# Patient Record
Sex: Male | Born: 1965 | Race: Black or African American | Hispanic: No | Marital: Married | Smoking: Former smoker
Health system: Southern US, Community
[De-identification: ages and names within clinical notes are randomized; demographics above are authoritative.]

## PROBLEM LIST (undated history)

## (undated) DIAGNOSIS — I10 Essential (primary) hypertension: Secondary | ICD-10-CM

## (undated) DIAGNOSIS — E119 Type 2 diabetes mellitus without complications: Secondary | ICD-10-CM

## (undated) DIAGNOSIS — M1990 Unspecified osteoarthritis, unspecified site: Secondary | ICD-10-CM

---

## 2015-10-24 ENCOUNTER — Other Ambulatory Visit: Payer: Self-pay | Admitting: Family Medicine

## 2017-09-13 ENCOUNTER — Encounter (HOSPITAL_BASED_OUTPATIENT_CLINIC_OR_DEPARTMENT_OTHER): Payer: Self-pay | Admitting: *Deleted

## 2017-09-13 ENCOUNTER — Emergency Department (HOSPITAL_BASED_OUTPATIENT_CLINIC_OR_DEPARTMENT_OTHER)
Admission: EM | Admit: 2017-09-13 | Discharge: 2017-09-13 | Disposition: A | Discharge status: Home / Self Care | Payer: Self-pay | Attending: Emergency Medicine | Admitting: Emergency Medicine

## 2017-09-13 DIAGNOSIS — Y939 Activity, unspecified: Secondary | ICD-10-CM | POA: Insufficient documentation

## 2017-09-13 DIAGNOSIS — Z79899 Other long term (current) drug therapy: Secondary | ICD-10-CM | POA: Insufficient documentation

## 2017-09-13 DIAGNOSIS — Y929 Unspecified place or not applicable: Secondary | ICD-10-CM | POA: Insufficient documentation

## 2017-09-13 DIAGNOSIS — X58XXXA Exposure to other specified factors, initial encounter: Secondary | ICD-10-CM | POA: Insufficient documentation

## 2017-09-13 DIAGNOSIS — F17200 Nicotine dependence, unspecified, uncomplicated: Secondary | ICD-10-CM | POA: Insufficient documentation

## 2017-09-13 DIAGNOSIS — Y999 Unspecified external cause status: Secondary | ICD-10-CM | POA: Insufficient documentation

## 2017-09-13 DIAGNOSIS — I10 Essential (primary) hypertension: Secondary | ICD-10-CM | POA: Insufficient documentation

## 2017-09-13 DIAGNOSIS — S46912A Strain of unspecified muscle, fascia and tendon at shoulder and upper arm level, left arm, initial encounter: Secondary | ICD-10-CM | POA: Insufficient documentation

## 2017-09-13 DIAGNOSIS — L239 Allergic contact dermatitis, unspecified cause: Secondary | ICD-10-CM | POA: Insufficient documentation

## 2017-09-13 HISTORY — DX: Unspecified osteoarthritis, unspecified site: M19.90

## 2017-09-13 HISTORY — DX: Essential (primary) hypertension: I10

## 2017-09-13 MED ORDER — PREDNISONE 20 MG PO TABS: ORAL_TABLET | ORAL | 0 refills | Status: DC

## 2017-09-13 MED ORDER — TRIAMCINOLONE ACETONIDE 0.025 % EX OINT: 1.0000 "application " | TOPICAL_OINTMENT | Freq: Two times a day (BID) | CUTANEOUS | 0 refills | Status: DC

## 2017-09-13 NOTE — ED Triage Notes (Signed)
Here for 2 complaints:  1.) c/o itchy rash/bites noted to all 4 extremities, and maybe a little on abd. (Denies rash b/w fingers, palms, feet, groin, back, chest or face). Itching worse at night. Onset ~ 3 weeks ago. No meds taken for sx.   2.) c/o L shoulder pain, pain constant, worse with movement, limited ROM, "can't lift it up", positive Neer's test.   Alert, NAD, calm, interactive, resps e/u, speaking in clear complete sentences, no dyspnea noted, skin W&D, VSS, (denies: known injury, fall, fever, NVD, numbness, tingling, weakness, CP, sob). Mentions h/o stiff neck and arthritis.

## 2017-09-13 NOTE — ED Provider Notes (Signed)
[facility] Provider Note   CSN: [id number] Arrival date & time: 09/13/17  7829     History   Chief Complaint Chief Complaint  Patient presents with  . Rash    HPI Zachary Flowers is a 51 y.o. male.  Patient is a 51 year old male who presents with rash and shoulder pain. He states he's had a rash for about a week. He states it's very itchy. It started with little bumps on his hands and progressed to his legs. He denies any known contacts with similar symptoms. He does think that he was maybe exposed to poison ivy. He denies any fevers. No vomiting. No facial swelling or shortness of breath. He also has some pain in his left shoulder. This is been going on for about 3 weeks. He states it hurts mostly when he tries to pick something up or raise his arm above his shoulder. He denies any radiation down his arm. No numbness or weakness to his arm.      Past Medical History:  Diagnosis Date  . Arthritis   . Hypertension     There are no active problems to display for this patient.   History reviewed. No pertinent surgical history.     Home Medications    Prior to Admission medications   Medication Sig Start Date End Date Taking? Authorizing Provider  AMLODIPINE BESYLATE PO Take by mouth.   Yes [provider]  HYDROCHLOROTHIAZIDE PO Take by mouth.   Yes [provider]  IBUPROFEN PO Take by mouth.   Yes [provider]  MELOXICAM PO Take by mouth.   Yes [provider]  predniSONE (DELTASONE) 20 MG tablet 3 tabs po day one, then 2 po daily x 4 days 09/13/17   Rolan Bucco, MD  triamcinolone (KENALOG) 0.025 % ointment Apply 1 application topically 2 (two) times daily. 09/13/17   Rolan Bucco, MD    Family History History reviewed. No pertinent family history.  Social History Social History  Substance Use Topics  . Smoking status: Current Every Day Smoker  . Smokeless tobacco: Never Used  . Alcohol use No      Allergies   Patient has no known allergies.   Review of Systems Review of Systems  Constitutional: Negative for chills, diaphoresis, fatigue and fever.  HENT: Negative for congestion, rhinorrhea and sneezing.   Eyes: Negative.   Respiratory: Negative for cough, chest tightness and shortness of breath.   Cardiovascular: Negative for chest pain and leg swelling.  Gastrointestinal: Negative for abdominal pain, blood in stool, diarrhea, nausea and vomiting.  Genitourinary: Negative for difficulty urinating, flank pain, frequency and hematuria.  Musculoskeletal: Positive for arthralgias. Negative for back pain.  Skin: Positive for rash.  Neurological: Negative for dizziness, speech difficulty, weakness, numbness and headaches.     Physical Exam Updated Vital Signs BP (!) 134/97   Pulse 75   Temp 98.5 F (36.9 C) (Oral)   Resp 18   Ht  (1.803 m)   Wt 90.7 kg (200 lb)   SpO2 98%   BMI 27.89 kg/m   Physical Exam  Constitutional: He is oriented to person, place, and time. He appears well-developed and well-nourished.  HENT:  Head: Normocephalic and atraumatic.  Eyes: Pupils are equal, round, and reactive to light.  Neck: Normal range of motion. Neck supple.  Cardiovascular: Normal rate, regular rhythm and normal heart sounds.   Pulmonary/Chest: Effort normal and breath sounds normal. No respiratory distress. He has no wheezes. He  has no rales. He exhibits no tenderness.  Abdominal: Soft. Bowel sounds are normal. There is no tenderness. There is no rebound and no guarding.  Musculoskeletal: He exhibits no edema.  Patient has tenderness on palpation of the lateral left shoulder. There is pain with abduction in the shoulder. There is no significant tenderness with external rotation. Is no pain to the elbow. No pain over the before meals joint. There is no swelling of the shoulder joint. He has normal sensation and motor function in the hand. Radial pulses are intact.   Lymphadenopathy:    He has no cervical adenopathy.  Neurological: He is alert and oriented to person, place, and time.  Skin: Skin is warm and dry. Rash noted.  Patient has small pustules on his upper extremities. There is none in the web spaces of the hands. No rash on the palms or soles. There is some clustering with some vesicles and pustules on the lower leg. There is no crusting. No petechiae or purpura. No surrounding cellulitis.  Psychiatric: He has a normal mood and affect.     ED Treatments / Results  Labs (all labs ordered are listed, but only abnormal results are displayed) Labs Reviewed - No data to display  EKG  EKG Interpretation None       Radiology No results found.  Procedures Procedures (including critical care time)  Medications Ordered in ED Medications - No data to display   Initial Impression / Assessment and Plan / ED Course  I have reviewed the triage vital signs and the nursing notes.  Pertinent labs & imaging results that were available during my care of the patient were reviewed by me and considered in my medical decision making (see chart for details).     Patient presents with 2 complaints. The rash looks consistent with a contact dermatitis type rash, likely from poison ivy. I will start him on a prednisone as well as triamcinolone cream. The shoulder sounds consistent with possible tendinitis. He has an orthopedist in [facility] and he will follow up with. He has no neurologic deficits. I don't feel at this point that he needs a sling. He was discharged home in good condition. He was advised to have his blood pressure rechecked by his primary care physician. He was advised not to use anti-inflammatory medications while taking the prednisone. Return precautions were given.  Final Clinical Impressions(s) / ED Diagnoses   Final diagnoses:  Allergic contact dermatitis, unspecified trigger  Shoulder strain, left, initial encounter    New  Prescriptions New Prescriptions   PREDNISONE (DELTASONE) 20 MG TABLET    3 tabs po day one, then 2 po daily x 4 days   TRIAMCINOLONE (KENALOG) 0.025 % OINTMENT    Apply 1 application topically 2 (two) times daily.     Rolan Bucco, MD 09/13/17 [phone]

## 2020-03-03 ENCOUNTER — Emergency Department (HOSPITAL_BASED_OUTPATIENT_CLINIC_OR_DEPARTMENT_OTHER)
Admission: EM | Admit: 2020-03-03 | Discharge: 2020-03-03 | Disposition: A | Discharge status: Home / Self Care | Payer: Self-pay | Attending: Emergency Medicine | Admitting: Emergency Medicine

## 2020-03-03 ENCOUNTER — Encounter (HOSPITAL_BASED_OUTPATIENT_CLINIC_OR_DEPARTMENT_OTHER): Payer: Self-pay | Admitting: Emergency Medicine

## 2020-03-03 ENCOUNTER — Other Ambulatory Visit: Payer: Self-pay

## 2020-03-03 DIAGNOSIS — I10 Essential (primary) hypertension: Secondary | ICD-10-CM | POA: Insufficient documentation

## 2020-03-03 DIAGNOSIS — R21 Rash and other nonspecific skin eruption: Secondary | ICD-10-CM | POA: Insufficient documentation

## 2020-03-03 DIAGNOSIS — Z79899 Other long term (current) drug therapy: Secondary | ICD-10-CM | POA: Insufficient documentation

## 2020-03-03 DIAGNOSIS — Z87891 Personal history of nicotine dependence: Secondary | ICD-10-CM | POA: Insufficient documentation

## 2020-03-03 MED ORDER — HYDROXYZINE HCL 25 MG PO TABS: 25.0000 mg | ORAL_TABLET | Freq: Four times a day (QID) | ORAL | 0 refills | Status: DC

## 2020-03-03 MED ORDER — DEXAMETHASONE SODIUM PHOSPHATE 10 MG/ML IJ SOLN
10.0000 mg | Freq: Once | INTRAMUSCULAR | Status: AC
Start: 2020-03-03 — End: 2020-03-03
  Administered 2020-03-03: 21:00:00 10 mg via INTRAMUSCULAR
  Filled 2020-03-03: qty 1

## 2020-03-03 MED ORDER — PERMETHRIN 5 % EX CREA: TOPICAL_CREAM | CUTANEOUS | 0 refills | Status: DC

## 2020-03-03 MED ORDER — HYDROXYZINE HCL 25 MG PO TABS
25.0000 mg | ORAL_TABLET | Freq: Once | ORAL | Status: AC
  Administered 2020-03-03: 25 mg via ORAL
  Filled 2020-03-03: qty 1

## 2020-03-03 NOTE — ED Triage Notes (Signed)
Reports rash all over body for several days.  Taking benadryl at home.  Last dose at 1pm.

## 2020-03-03 NOTE — ED Provider Notes (Signed)
[facility] EMERGENCY DEPARTMENT Provider Note   CSN: [id number] Arrival date & time: 03/03/20  1932     History Chief Complaint  Patient presents with  . Rash    Zachary Flowers is a 54 y.o. male.  Patient is a 54 year old male who presents with a rash.  He reports a 2 to 3-day history of an itchy rash.  It started with little bumps on his forearms and progressed to his hands and then all over his body.  He says it is very itchy and bothering him because of the itching.  He has not had any new exposures to anything.  No known exposures to scabies.  He has another family member at home but does not have a rash.  He has had allergic reaction to a knee brace in the past but has not had a rash like this.  He has been using Benadryl with no improvement in symptoms.  He has no wheezing or airway involvement.  No swelling of his lips or tongue.  He does not otherwise feel unwell.  No fevers.        Past Medical History:  Diagnosis Date  . Arthritis   . Hypertension     There are no problems to display for this patient.   History reviewed. No pertinent surgical history.     No family history on file.  Social History   Tobacco Use  . Smoking status: Former Games developer  . Smokeless tobacco: Never Used  Substance Use Topics  . Alcohol use: No  . Drug use: No    Home Medications Prior to Admission medications   Medication Sig Start Date End Date Taking? Authorizing Provider  AMLODIPINE BESYLATE PO Take by mouth.    [provider]  HYDROCHLOROTHIAZIDE PO Take by mouth.    [provider]  hydrOXYzine (ATARAX/VISTARIL) 25 MG tablet Take 1 tablet (25 mg total) by mouth every 6 (six) hours. 03/03/20   Rolan Bucco, MD  IBUPROFEN PO Take by mouth.    [provider]  MELOXICAM PO Take by mouth.    [provider]  permethrin (ELIMITE) 5 % cream Apply from head to toe, including scalp.  Leave on for 8 hours, then wash off.  May repeat in  one week if needed. 03/03/20   Rolan Bucco, MD  predniSONE (DELTASONE) 20 MG tablet 3 tabs po day one, then 2 po daily x 4 days 09/13/17   Rolan Bucco, MD  triamcinolone (KENALOG) 0.025 % ointment Apply 1 application topically 2 (two) times daily. 09/13/17   Rolan Bucco, MD    Allergies    Patient has no known allergies.  Review of Systems   Review of Systems  Constitutional: Negative for chills, diaphoresis, fatigue and fever.  HENT: Negative for congestion, rhinorrhea and sneezing.   Eyes: Negative.   Respiratory: Negative for cough, chest tightness and shortness of breath.   Cardiovascular: Negative for chest pain and leg swelling.  Gastrointestinal: Negative for abdominal pain, blood in stool, diarrhea, nausea and vomiting.  Genitourinary: Negative for difficulty urinating, flank pain, frequency and hematuria.  Musculoskeletal: Negative for arthralgias and back pain.  Skin: Positive for rash.  Neurological: Negative for dizziness, speech difficulty, weakness, numbness and headaches.    Physical Exam Updated Vital Signs BP (!) 159/114 (BP Location: Left Arm)   Pulse 82   Temp 98.8 F (37.1 C) (Oral)   Resp 18   Ht 6' (1.829 m)   Wt 95.3 kg  SpO2 100%   BMI 28.48 kg/m   Physical Exam Constitutional:      Appearance: He is well-developed.  HENT:     Head: Normocephalic and atraumatic.  Eyes:     Pupils: Pupils are equal, round, and reactive to light.  Cardiovascular:     Rate and Rhythm: Normal rate and regular rhythm.     Heart sounds: Normal heart sounds.  Pulmonary:     Effort: Pulmonary effort is normal. No respiratory distress.     Breath sounds: Normal breath sounds. No wheezing or rales.  Chest:     Chest wall: No tenderness.  Abdominal:     General: Bowel sounds are normal.     Palpations: Abdomen is soft.     Tenderness: There is no abdominal tenderness. There is no guarding or rebound.  Musculoskeletal:        General: Normal range of motion.      Cervical back: Normal range of motion and neck supple.  Lymphadenopathy:     Cervical: No cervical adenopathy.  Skin:    General: Skin is warm and dry.     Findings: Rash present.     Comments: Patient has small raised bumps to his forearm and chest.  Some of the bumps have a small whitehead on top of them.  There is some in the webspaces of the fingers and on the palms.  There is no vesicles.  No petechiae or purpura.  Neurological:     Mental Status: He is alert and oriented to person, place, and time.     ED Results / Procedures / Treatments   Labs (all labs ordered are listed, but only abnormal results are displayed) Labs Reviewed - No data to display  EKG None  Radiology No results found.  Procedures Procedures (including critical care time)  Medications Ordered in ED Medications  dexamethasone (DECADRON) injection 10 mg (has no administration in time range)  hydrOXYzine (ATARAX/VISTARIL) tablet 25 mg (has no administration in time range)    ED Course  I have reviewed the triage vital signs and the nursing notes.  Pertinent labs & imaging results that were available during my care of the patient were reviewed by me and considered in my medical decision making (see chart for details).    MDM Rules/Calculators/A&P                      Patient with a diffuse pruritic rash.  It looks a bit like scabies although he has not had any known exposures to scabies.  I will go ahead and treat him with Elimite cream and also treat for possible allergic reaction with a shot of Decadron and prescription for Atarax for the itching.  He has an appointment in April to follow-up with his PCP in [facility].  He [facility] the community clinic in [facility].  I advised him if the rash is not improving to try to get in sooner.  If he has any worsening symptoms he can be seen again in the emergency room. Final Clinical Impression(s) / ED Diagnoses Final diagnoses:  Rash    Rx / DC  Orders ED Discharge Orders         Ordered    hydrOXYzine (ATARAX/VISTARIL) 25 MG tablet  Every 6 hours     03/03/20 2040    permethrin (ELIMITE) 5 % cream     03/03/20 2040           Zachary Vilardi,  MD 03/03/20 2043

## 2020-03-14 ENCOUNTER — Encounter (HOSPITAL_BASED_OUTPATIENT_CLINIC_OR_DEPARTMENT_OTHER): Payer: Self-pay

## 2020-03-14 ENCOUNTER — Other Ambulatory Visit: Payer: Self-pay

## 2020-03-14 ENCOUNTER — Emergency Department (HOSPITAL_BASED_OUTPATIENT_CLINIC_OR_DEPARTMENT_OTHER)
Admission: EM | Admit: 2020-03-14 | Discharge: 2020-03-15 | Disposition: A | Discharge status: Home / Self Care | Payer: Self-pay | Attending: Emergency Medicine | Admitting: Emergency Medicine

## 2020-03-14 DIAGNOSIS — F1210 Cannabis abuse, uncomplicated: Secondary | ICD-10-CM | POA: Insufficient documentation

## 2020-03-14 DIAGNOSIS — Z87891 Personal history of nicotine dependence: Secondary | ICD-10-CM | POA: Insufficient documentation

## 2020-03-14 DIAGNOSIS — R21 Rash and other nonspecific skin eruption: Secondary | ICD-10-CM | POA: Insufficient documentation

## 2020-03-14 DIAGNOSIS — I10 Essential (primary) hypertension: Secondary | ICD-10-CM | POA: Insufficient documentation

## 2020-03-14 NOTE — ED Triage Notes (Signed)
Pt c/o scattered rash x 2-3 weeks-seen here for same 3/28-states no better

## 2020-03-15 LAB — RPR: RPR Ser Ql: NONREACTIVE

## 2020-03-15 MED ORDER — HYDROXYZINE HCL 25 MG PO TABS: 25.0000 mg | ORAL_TABLET | Freq: Four times a day (QID) | ORAL | 0 refills | Status: DC

## 2020-03-15 MED ORDER — FLUCONAZOLE 200 MG PO TABS: 200.0000 mg | ORAL_TABLET | Freq: Every day | ORAL | 0 refills | Status: DC

## 2020-03-15 MED ORDER — FLUCONAZOLE 150 MG PO TABS
ORAL_TABLET | ORAL | Status: AC
  Administered 2020-03-15: 01:00:00 150 mg
  Filled 2020-03-15: qty 1

## 2020-03-15 MED ORDER — METHYLPREDNISOLONE SODIUM SUCC 125 MG IJ SOLR
125.0000 mg | Freq: Once | INTRAMUSCULAR | Status: AC
  Administered 2020-03-15: 01:00:00 125 mg via INTRAMUSCULAR
  Filled 2020-03-15: qty 2

## 2020-03-15 MED ORDER — FLUCONAZOLE 200 MG PO TABS
200.0000 mg | ORAL_TABLET | Freq: Once | ORAL | Status: DC
  Filled 2020-03-15: qty 1

## 2020-03-15 MED ORDER — PREDNISONE 20 MG PO TABS: ORAL_TABLET | ORAL | 0 refills | Status: DC

## 2020-03-15 MED ORDER — TRIAMCINOLONE ACETONIDE 0.025 % EX OINT: 1.0000 "application " | TOPICAL_OINTMENT | Freq: Two times a day (BID) | CUTANEOUS | 0 refills | Status: DC

## 2020-03-15 NOTE — ED Provider Notes (Signed)
Emergency Department Provider Note   I have reviewed the triage vital signs and the nursing notes.   HISTORY  Chief Complaint Rash   HPI Zachary Flowers is a 54 y.o. male with medical problems as listed below who presents to the emergency department today with 3 days of rash.  Patient states that started on his chest with some vesicular lesions.  Progressively worsening and now is on his side both arms and his palms.  He has a couple spots on his back and a couple spots on his legs but not new.  The rest of his body is rash free.  He has had no fevers, nausea, vomiting or other associated symptoms.  He switched over to hypoallergenic soap.  He was seen here about 10 days ago was started on permethrin and hydroxyzine states helps with itching but the rash is getting worse even with the permethrin.  Does not notice any bugs that have been biting him.  Also does not know anything he might be allergic to.   No other associated or modifying symptoms.    Past Medical History:  Diagnosis Date  . Arthritis   . Hypertension     There are no problems to display for this patient.   History reviewed. No pertinent surgical history.  Current Outpatient Rx  . Order #: [id number] Class: Historical Med  . Order #: [id number] Class: Print  . Order #: [id number] Class: Historical Med  . Order #: [id number] Class: Print  . Order #: [id number] Class: Historical Med  . Order #: [id number] Class: Historical Med  . Order #: [id number] Class: Print  . Order #: [id number] Class: Print    Allergies Patient has no known allergies.  No family history on file.  Social History Social History   Tobacco Use  . Smoking status: Former Research scientist (life sciences)  . Smokeless tobacco: Never Used  Substance Use Topics  . Alcohol use: No  . Drug use: Yes    Types: Marijuana    Review of Systems  All other systems negative except as documented in the HPI. All pertinent positives and negatives as reviewed in the  HPI. ____________________________________________   PHYSICAL EXAM:  VITAL SIGNS: ED Triage Vitals  Enc Vitals Group     BP 03/14/20 2212 136/90     Pulse Rate 03/14/20 2212 87     Resp 03/14/20 2212 16     Temp 03/14/20 2212 98.8 F (37.1 C)     Temp Source 03/14/20 2212 Oral     SpO2 03/14/20 2212 99 %     Weight 03/14/20 2212 214 lb 1.6 oz (97.1 kg)     Height --      Head Circumference --      Peak Flow --      Pain Score 03/14/20 2226 5     Pain Loc --      Pain Edu? --      Excl. in [place]? --     Constitutional: Alert and oriented. Well appearing and in no acute distress. Eyes: Conjunctivae are normal. PERRL. EOMI. Head: Atraumatic. Nose: No congestion/rhinnorhea. Mouth/Throat: Mucous membranes are moist.  Oropharynx non-erythematous. Neck: No stridor.  No meningeal signs.   Cardiovascular: Normal rate, regular rhythm. Good peripheral circulation. Grossly normal heart sounds.   Respiratory: Normal respiratory effort.  No retractions. Lungs CTAB. Gastrointestinal: Soft and nontender. No distention.  Musculoskeletal: No lower extremity tenderness nor edema. No gross deformities of extremities. Neurologic:  Normal speech and language. No gross focal neurologic deficits are appreciated.  Skin: Multiple  areas on his torso mostly the left and anterior portions of grouped vesicular lesions with central clearing.  Pruritic.  Mildly erythematous.  Mildly painful.  Not significantly warm to touch.  Draining clear fluid.  Has some gray lesions on the palms of both hands that are coalescing.  Also has some similar vesicular lesions on his hands.  Has less amount of lesions on the right flank, back and lower extremities.  None on the face or neck.   ____________________________________________   INITIAL IMPRESSION / ASSESSMENT AND PLAN / ED COURSE  Difficult to fully diagnose with the rashes.  With the grayish lesions on his palms will check for syphilis but think this is less likely  as it is itching all over.  Also consider possible contact dermatitis however does not have that typical distribution for that.  Also consider a fungal rash as well.  If not consistent with EM, meningococcemia, HSP or other emergent rashes.  Does not appear consistent with [facility] spotted fever either.  Also considered atypical varicella but distribution does not seem right. Treat symptoms with hydroxyzine and steroids.  We will also cover for possible fungal rash and fluconazole.  Follow-up with PCP in 3 to 5 days if not improved.    Pertinent labs & imaging results that were available during my care of the patient were reviewed by me and considered in my medical decision making (see chart for details).  A medical screening exam was performed and I feel the patient has had an appropriate workup for their chief complaint at this time and likelihood of emergent condition existing is low. They have been counseled on decision, discharge, follow up and which symptoms necessitate immediate return to the emergency department. They or their family verbally stated understanding and agreement with plan and discharged in stable condition.   ____________________________________________  FINAL CLINICAL IMPRESSION(S) / ED DIAGNOSES  Final diagnoses:  Rash     MEDICATIONS GIVEN DURING THIS VISIT:  Medications  methylPREDNISolone sodium succinate (SOLU-MEDROL) 125 mg/2 mL injection 125 mg (has no administration in time range)  fluconazole (DIFLUCAN) tablet 200 mg (has no administration in time range)     NEW OUTPATIENT MEDICATIONS STARTED DURING THIS VISIT:  New Prescriptions   FLUCONAZOLE (DIFLUCAN) 200 MG TABLET    Take 1 tablet (200 mg total) by mouth daily.   PREDNISONE (DELTASONE) 20 MG TABLET    3 tabs po daily x 3 days, then 2 tabs x 3 days, then 1.5 tabs x 3 days, then 1 tab x 3 days, then 0.5 tabs x 3 days    Note:  This note was prepared with assistance of Dragon voice recognition  software. Occasional wrong-word or sound-a-like substitutions may have occurred due to the inherent limitations of voice recognition software.   Perez Dirico, Barbara Cower, MD 03/15/20 [phone]

## 2020-04-02 ENCOUNTER — Encounter (HOSPITAL_BASED_OUTPATIENT_CLINIC_OR_DEPARTMENT_OTHER): Payer: Self-pay

## 2020-04-02 ENCOUNTER — Emergency Department (HOSPITAL_BASED_OUTPATIENT_CLINIC_OR_DEPARTMENT_OTHER)
Admission: EM | Admit: 2020-04-02 | Discharge: 2020-04-02 | Disposition: A | Discharge status: Home / Self Care | Payer: Self-pay | Attending: Emergency Medicine | Admitting: Emergency Medicine

## 2020-04-02 ENCOUNTER — Other Ambulatory Visit: Payer: Self-pay

## 2020-04-02 DIAGNOSIS — Z87891 Personal history of nicotine dependence: Secondary | ICD-10-CM | POA: Insufficient documentation

## 2020-04-02 DIAGNOSIS — R21 Rash and other nonspecific skin eruption: Secondary | ICD-10-CM | POA: Insufficient documentation

## 2020-04-02 DIAGNOSIS — Z79899 Other long term (current) drug therapy: Secondary | ICD-10-CM | POA: Insufficient documentation

## 2020-04-02 DIAGNOSIS — I10 Essential (primary) hypertension: Secondary | ICD-10-CM | POA: Insufficient documentation

## 2020-04-02 MED ORDER — PREDNISONE 10 MG PO TABS: ORAL_TABLET | ORAL | 0 refills | Status: DC

## 2020-04-02 MED ORDER — PREDNISONE 10 MG PO TABS: 20.0000 mg | ORAL_TABLET | Freq: Every day | ORAL | 0 refills | Status: DC

## 2020-04-02 MED ORDER — PREDNISONE 10 MG PO TABS: ORAL_TABLET | ORAL | 0 refills | Status: AC

## 2020-04-02 NOTE — ED Provider Notes (Signed)
[facility] DEPARTMENT Provider Note   CSN: [id number] Arrival date & time: 04/02/20  1013     History Chief Complaint  Patient presents with  . Rash    Zachary Flowers is a 54 y.o. male.  HPI 54 year old male with a history of hypertension, arthritis with a history of a rash for several months presents to the ER for similar complaint.  Per chart review, patient was seen on 03/14/2020 and 03/03/2020 for same rash and received a multitude of treatments including Decadron, hydroxyzine, permethrin, fluconazole.  Patient reports compliance with the steroids prescribed him on his 03/14/20 visit and an improvement in his rash.  He states he took his last pill on Saturday and the rash started increasing again on Sunday.  He states he came to the ER because he does not want it to get out of hand like it did last time.  He reports  a rash to his right and left hand, and left ribs and sides.  States the rash is mildly itchy, mildly painful.  States that it becomes pustulous and he pops them.  Denies any fevers/chills.  He does not have any history of STDs.  He states that he was tested here for syphilis and was negative. No noticeable bug bites, patient does not work outside or near any plants.    Past Medical History:  Diagnosis Date  . Arthritis   . Hypertension     There are no problems to display for this patient.   History reviewed. No pertinent surgical history.     No family history on file.  Social History   Tobacco Use  . Smoking status: Former Research scientist (life sciences)  . Smokeless tobacco: Never Used  Substance Use Topics  . Alcohol use: No  . Drug use: Yes    Types: Marijuana    Home Medications Prior to Admission medications   Medication Sig Start Date End Date Taking? Authorizing Provider  AMLODIPINE BESYLATE PO Take by mouth.    [provider]  fluconazole (DIFLUCAN) 200 MG tablet Take 1 tablet (200 mg total) by mouth daily. 03/15/20   Mesner, Corene Cornea, MD   HYDROCHLOROTHIAZIDE PO Take by mouth.    [provider]  hydrOXYzine (ATARAX/VISTARIL) 25 MG tablet Take 1 tablet (25 mg total) by mouth every 6 (six) hours. 03/15/20   Mesner, Corene Cornea, MD  IBUPROFEN PO Take by mouth.    [provider]  MELOXICAM PO Take by mouth.    [provider]  predniSONE (DELTASONE) 10 MG tablet Take 3 tablets (30 mg total) by mouth daily with breakfast for 3 days, THEN 2 tablets (20 mg total) daily with breakfast for 3 days, THEN 1.5 tablets (15 mg total) daily with breakfast for 3 days, THEN 1 tablet (10 mg total) daily with breakfast for 3 days, THEN 0.5 tablets (5 mg total) daily with breakfast for 3 days. 04/02/20 04/17/20  Garald Balding, PA-C  triamcinolone (KENALOG) 0.025 % ointment Apply 1 application topically 2 (two) times daily. 03/15/20   Mesner, Corene Cornea, MD    Allergies    Ace inhibitors  Review of Systems   Review of Systems  Constitutional: Negative for chills and fever.  Genitourinary: Negative for difficulty urinating, dysuria, genital sores, penile pain and penile swelling.  Musculoskeletal: Negative for back pain and neck pain.  Skin: Positive for rash.    Physical Exam Updated Vital Signs BP (!) 140/107 (BP Location: Left Arm)   Pulse 97   Temp 98.6 F (  37 C) (Oral)   Resp 14   Ht 5\' 11"  (1.803 m)   Wt 97.5 kg   SpO2 100%   BMI 29.99 kg/m   Physical Exam Vitals and nursing note reviewed.  Constitutional:      General: He is not in acute distress.    Appearance: He is well-developed. He is obese. He is not ill-appearing, toxic-appearing or diaphoretic.  HENT:     Head: Normocephalic and atraumatic.     Mouth/Throat:     Mouth: Mucous membranes are moist.     Pharynx: Oropharynx is clear.  Eyes:     Conjunctiva/sclera: Conjunctivae normal.  Cardiovascular:     Rate and Rhythm: Normal rate and regular rhythm.     Heart sounds: No murmur.  Pulmonary:     Effort: Pulmonary effort is normal. No respiratory  distress.     Breath sounds: Normal breath sounds.  Abdominal:     Palpations: Abdomen is soft.     Tenderness: There is no abdominal tenderness.  Musculoskeletal:        General: Normal range of motion.     Cervical back: Neck supple.  Skin:    General: Skin is warm and dry.     Findings: Rash present. No bruising.     Comments: Papular rash in clusters in multiple areas of the body including the fingers, hands, left lateral abdomen in multiple stages of healing.  Round coalescent macular rash on left hand.  Please see photos and physical exam.  Neurological:     General: No focal deficit present.     Mental Status: He is alert and oriented to person, place, and time.  Psychiatric:        Mood and Affect: Mood normal.        Behavior: Behavior normal.           ED Results / Procedures / Treatments   Labs (all labs ordered are listed, but only abnormal results are displayed) Labs Reviewed - No data to display  EKG None  Radiology No results found.  Procedures Procedures (including critical care time)  Medications Ordered in ED Medications - No data to display  ED Course  I have reviewed the triage vital signs and the nursing notes.  Pertinent labs & imaging results that were available during my care of the patient were reviewed by me and considered in my medical decision making (see chart for details).    MDM Rules/Calculators/A&P                      54 year old male with recurrent rash. On presentation, patient is well-appearing, no acute distress.  Vitals reassuring, patient afebrile.  Patient reports he has a dermatology appointment in 2 weeks.  Based on previous chart review, it appears that this rash affects different areas.  Rashes itchy and mildly painful.  Patient was previously treated for scabies, fungal infection without much improvement.  Patient states that he was given steroids and fluconazole at last visit and rash did slightly improve but stopped  as soon he states he stopped the steroid.  Unclear if the fluconazole or the steroid helped, but I suspect the steroid was more helpful given as soon as he stopped it the rash returned.  Patient tested negative for syphilis on previous visit on 03/15/2020.  Not consistent with HSP, meningococcemia, Stevens-Johnson's rash, EM, [facility] spotted fever, Lyme disease.  Possible nummular eczema.  Will provide another steroid taper in hopes that  this will hold him over until his dermatology appointment.  Difficult to diagnose rash.  Return precautions given.  At this stage in the ED course, the patient has been medically screened and is stable for discharge.  Patient voices understanding is agreeable to this plan.   I discussed the case with Dr. Pecola Leisure and she is agreeable with the above plan. Final Clinical Impression(s) / ED Diagnoses Final diagnoses:  Rash    Rx / DC Orders ED Discharge Orders         Ordered    predniSONE (DELTASONE) 10 MG tablet  Daily,   Status:  Discontinued     04/02/20 1355    predniSONE (DELTASONE) 10 MG tablet  Status:  Discontinued     04/02/20 1357    predniSONE (DELTASONE) 10 MG tablet  Status:  Discontinued     04/02/20 1404    predniSONE (DELTASONE) 10 MG tablet  Status:  Discontinued     04/02/20 1406    predniSONE (DELTASONE) 10 MG tablet     04/02/20 1408           Leone Brand 04/02/20 1641    Tilden Fossa, MD 04/02/20 1743

## 2020-04-02 NOTE — ED Triage Notes (Signed)
Pt arrives with rash to hands and arms, reports he was recently on Prednisone states it was getting better but took his last medication on Saturday and states that the rash is returning.

## 2020-04-02 NOTE — Discharge Instructions (Addendum)
You were seen in the ER for a rash.  It is still unclear as to what the cause of this rash is but we will give you another dose of steroids until you are able to see the dermatologist.  It is very important that you keep your appointment in intended.  Return to the ER if your symptoms worsen.

## 2020-08-29 ENCOUNTER — Encounter (HOSPITAL_BASED_OUTPATIENT_CLINIC_OR_DEPARTMENT_OTHER): Payer: Self-pay | Admitting: Emergency Medicine

## 2020-08-29 ENCOUNTER — Emergency Department (HOSPITAL_BASED_OUTPATIENT_CLINIC_OR_DEPARTMENT_OTHER)
Admission: EM | Admit: 2020-08-29 | Discharge: 2020-08-29 | Discharge status: Against Medical Advice | Payer: Self-pay | Attending: Emergency Medicine | Admitting: Emergency Medicine

## 2020-08-29 ENCOUNTER — Emergency Department (HOSPITAL_BASED_OUTPATIENT_CLINIC_OR_DEPARTMENT_OTHER): Payer: Self-pay

## 2020-08-29 ENCOUNTER — Other Ambulatory Visit: Payer: Self-pay

## 2020-08-29 DIAGNOSIS — I10 Essential (primary) hypertension: Secondary | ICD-10-CM | POA: Insufficient documentation

## 2020-08-29 DIAGNOSIS — R456 Violent behavior: Secondary | ICD-10-CM | POA: Insufficient documentation

## 2020-08-29 DIAGNOSIS — Z87891 Personal history of nicotine dependence: Secondary | ICD-10-CM | POA: Insufficient documentation

## 2020-08-29 DIAGNOSIS — R202 Paresthesia of skin: Secondary | ICD-10-CM | POA: Insufficient documentation

## 2020-08-29 DIAGNOSIS — Z79899 Other long term (current) drug therapy: Secondary | ICD-10-CM | POA: Insufficient documentation

## 2020-08-29 LAB — TROPONIN I (HIGH SENSITIVITY): Troponin I (High Sensitivity): 8 ng/L (ref <18)

## 2020-08-29 MED ORDER — HALOPERIDOL LACTATE 5 MG/ML IJ SOLN
2.0000 mg | Freq: Once | INTRAMUSCULAR | Status: AC
  Administered 2020-08-29: 2 mg via INTRAVENOUS
  Filled 2020-08-29: qty 1

## 2020-08-29 MED ORDER — ACETAMINOPHEN 500 MG PO TABS
1000.0000 mg | ORAL_TABLET | Freq: Once | ORAL | Status: DC
  Filled 2020-08-29: qty 2

## 2020-08-29 NOTE — ED Notes (Signed)
  Patient states he would like to leave now and not wait on blood work.  Patient was given some IV medication for panic attack but states he does not feel better and can not breathe.  Patient requests PIV taken out so he can leave.  Patients wife at bedside attempting to talk patient into staying but patient is adamant that he is leaving now.  Dr Nicanor Alcon notified.

## 2020-08-29 NOTE — ED Provider Notes (Addendum)
[facility] EMERGENCY DEPARTMENT Provider Note   CSN: [id number] Arrival date & time: 08/29/20  0357     History Chief Complaint  Patient presents with  . Shortness of Breath  . Numbness    Kobie Whidby is a 54 y.o. male.  The history is provided by the patient and the spouse.  Shortness of Breath Severity:  Moderate Onset quality:  Sudden Timing:  Constant Progression:  Unchanged Chronicity:  New Context: emotional upset   Context: not URI   Context comment:  Got upset and has been pacing secondary to paresthesias in right middle finger  Relieved by:  Nothing Worsened by:  Nothing Ineffective treatments:  None tried Associated symptoms: no abdominal pain, no chest pain, no claudication, no cough, no diaphoresis, no ear pain, no fever, no headaches, no hemoptysis, no neck pain, no PND, no rash, no sore throat, no syncope and no swollen glands   Risk factors: no hx of PE/DVT   Patient with HTN and anxiety presents with SOB and feeling anxious related to paresthesias in R middle finger.  States the medial aspect of the finger has been tingling for 2.5 days and this is upsetting to him. No weakness.  No changes in vision or speech.  No chest pain.  No n/v/d.  No DOE.  No exertional symptoms.  No leg pain or swelling no travel.  No f/c/r.  No cough.  No covid exposure.  States he can't sleep and feels anxious.  Is tearing off his mask in the room and can't stay still.      Past Medical History:  Diagnosis Date  . Arthritis   . Hypertension     There are no problems to display for this patient.   History reviewed. No pertinent surgical history.     History reviewed. No pertinent family history.  Social History   Tobacco Use  . Smoking status: Former Games developer  . Smokeless tobacco: Never Used  Vaping Use  . Vaping Use: Never used  Substance Use Topics  . Alcohol use: No  . Drug use: Yes    Types: Marijuana    Home Medications Prior to Admission  medications   Medication Sig Start Date End Date Taking? Authorizing Provider  AMLODIPINE BESYLATE PO Take by mouth.    [provider]  fluconazole (DIFLUCAN) 200 MG tablet Take 1 tablet (200 mg total) by mouth daily. 03/15/20   Mesner, Barbara Cower, MD  HYDROCHLOROTHIAZIDE PO Take by mouth.    [provider]  hydrOXYzine (ATARAX/VISTARIL) 25 MG tablet Take 1 tablet (25 mg total) by mouth every 6 (six) hours. 03/15/20   Mesner, Barbara Cower, MD  IBUPROFEN PO Take by mouth.    [provider]  MELOXICAM PO Take by mouth.    [provider]  triamcinolone (KENALOG) 0.025 % ointment Apply 1 application topically 2 (two) times daily. 03/15/20   Mesner, Barbara Cower, MD    Allergies    Ace inhibitors  Review of Systems   Review of Systems  Constitutional: Negative for diaphoresis and fever.  HENT: Negative for ear pain and sore throat.   Eyes: Negative for visual disturbance.  Respiratory: Positive for shortness of breath. Negative for cough and hemoptysis.   Cardiovascular: Negative for chest pain, palpitations, claudication, leg swelling, syncope and PND.  Gastrointestinal: Negative for abdominal pain.  Genitourinary: Negative for difficulty urinating.  Musculoskeletal: Negative for neck pain.  Skin: Negative for rash.  Neurological: Negative for headaches.  Psychiatric/Behavioral: Positive for agitation. The patient  is nervous/anxious.   All other systems reviewed and are negative.   Physical Exam Updated Vital Signs BP (!) 161/107 (BP Location: Right Arm)   Pulse 92   Temp 97.9 F (36.6 C) (Oral)   Resp (!) 24   Ht 6' (1.829 m)   Wt 97.5 kg   SpO2 99%   BMI 29.16 kg/m   Physical Exam Vitals and nursing note reviewed.  Constitutional:      General: He is not in acute distress.    Appearance: He is not diaphoretic.  HENT:     Head: Normocephalic and atraumatic.     Nose: Nose normal.  Eyes:     Conjunctiva/sclera: Conjunctivae normal.     Pupils: Pupils  are equal, round, and reactive to light.  Cardiovascular:     Rate and Rhythm: Normal rate and regular rhythm.     Pulses: Normal pulses.     Heart sounds: Normal heart sounds.  Pulmonary:     Effort: Pulmonary effort is normal.     Breath sounds: Normal breath sounds.  Abdominal:     General: Abdomen is flat. Bowel sounds are normal.     Palpations: Abdomen is soft.     Tenderness: There is no abdominal tenderness. There is no guarding.  Musculoskeletal:        General: No tenderness. Normal range of motion.     Right lower leg: No edema.     Left lower leg: No edema.  Skin:    General: Skin is warm and dry.     Capillary Refill: Capillary refill takes less than 2 seconds.  Neurological:     General: No focal deficit present.     Mental Status: He is alert and oriented to person, place, and time.     Sensory: No sensory deficit.     Motor: No weakness.     Deep Tendon Reflexes: Reflexes normal.  Psychiatric:        Mood and Affect: Mood is anxious.        Behavior: Behavior is aggressive.     ED Results / Procedures / Treatments   Labs (all labs ordered are listed, but only abnormal results are displayed) Labs Reviewed  TROPONIN I (HIGH SENSITIVITY)    EKG EKG Interpretation  Date/Time:  Thursday August 29 2020 04:05:34 EDT Ventricular Rate:  93 PR Interval:    QRS Duration: 87 QT Interval:  380 QTC Calculation: 473 R Axis:   -16 Text Interpretation: Sinus rhythm Confirmed by Alsie Younes ([id number]) on 08/29/2020 4:07:41 AM   Radiology DG Chest Portable 1 View  Result Date: 08/29/2020 CLINICAL DATA:  Dyspnea EXAM: PORTABLE CHEST 1 VIEW COMPARISON:  11/14/2018 FINDINGS: Lungs are clear. No pneumothorax or pleural effusion. Cardiac size within normal limits. Mild ectasias of the thoracic aorta is likely artifactual and related to semi upright positioning. Pulmonary vascularity is normal. IMPRESSION: No active disease. Electronically Signed   By: Helyn Numbers MD    On: 08/29/2020 04:23    Procedures Procedures (including critical care time)  Medications Ordered in ED Medications  acetaminophen (TYLENOL) tablet 1,000 mg (1,000 mg Oral Not Given 08/29/20 0423)  haloperidol lactate (HALDOL) injection 2 mg (2 mg Intravenous Given 08/29/20 0427)    ED Course  I have reviewed the triage vital signs and the nursing notes.  Pertinent labs & imaging results that were available during my care of the patient were reviewed by me and considered in my medical decision making (see chart for  details).   Full neurologic exam is normal.  There are no signs of stroke.    EDP tried to calm the patient and start EKG looked normal and that we would ensure the patient had no life threatening illness but the patient is saturating 100% on room air with a normal heart rate and his lungs are completely clear.    Patient was very anxious and was given Haldol to calm him so we could proceed with the work up for cardiac issues.  EDP went to see another patient and was informed the patient left.  EKG is normal as is chest Xray.    I see no signs of infectious etiology.    Patient eloped during work up.  Patient was clearly having a panic attack but did not stay to rule out life threatening illnesses.    Chaitanya Amedee was evaluated in Emergency Department on 08/29/2020 for the symptoms described in the history of present illness. He was evaluated in the context of the global COVID-19 pandemic, which necessitated consideration that the patient might be at risk for infection with the SARS-CoV-2 virus that causes COVID-19. Institutional protocols and algorithms that pertain to the evaluation of patients at risk for COVID-19 are in a state of rapid change based on information released by regulatory bodies including the CDC and federal and state organizations. These policies and algorithms were followed during the patient's care in the ED.  Final Clinical Impression(s) / ED  Diagnoses Final diagnoses:  Paresthesias    Patient eloped during the work up, prior to reassessment      Ritaj Dullea, MD 08/29/20 [phone]

## 2020-08-29 NOTE — ED Triage Notes (Signed)
  Patient comes in with SOB and R arm numbness that he states has been going on for about 2 days.  Patient was recently started on a third BP medication and has hx of anxiety.  Patient states he has been pacing around all night because he feels like he cant breathe and is afraid to go to sleep.  R arm feels like he hit his "funny bone".  No cough, running nose, fever, or body aches.  Pain 5/10.

## 2020-08-31 ENCOUNTER — Emergency Department (HOSPITAL_BASED_OUTPATIENT_CLINIC_OR_DEPARTMENT_OTHER)
Admission: EM | Admit: 2020-08-31 | Discharge: 2020-08-31 | Disposition: A | Discharge status: Home / Self Care | Payer: Self-pay | Attending: Emergency Medicine | Admitting: Emergency Medicine

## 2020-08-31 ENCOUNTER — Encounter (HOSPITAL_BASED_OUTPATIENT_CLINIC_OR_DEPARTMENT_OTHER): Payer: Self-pay | Admitting: Emergency Medicine

## 2020-08-31 ENCOUNTER — Other Ambulatory Visit: Payer: Self-pay

## 2020-08-31 DIAGNOSIS — Z87891 Personal history of nicotine dependence: Secondary | ICD-10-CM | POA: Insufficient documentation

## 2020-08-31 DIAGNOSIS — F411 Generalized anxiety disorder: Secondary | ICD-10-CM | POA: Insufficient documentation

## 2020-08-31 DIAGNOSIS — F418 Other specified anxiety disorders: Secondary | ICD-10-CM

## 2020-08-31 DIAGNOSIS — R200 Anesthesia of skin: Secondary | ICD-10-CM

## 2020-08-31 DIAGNOSIS — R202 Paresthesia of skin: Secondary | ICD-10-CM | POA: Insufficient documentation

## 2020-08-31 DIAGNOSIS — I10 Essential (primary) hypertension: Secondary | ICD-10-CM | POA: Insufficient documentation

## 2020-08-31 MED ORDER — HYDROXYZINE HCL 50 MG PO TABS: 25.0000 mg | ORAL_TABLET | Freq: Four times a day (QID) | ORAL | 1 refills | Status: DC | PRN

## 2020-08-31 MED ORDER — HYDROXYZINE HCL 25 MG PO TABS
50.0000 mg | ORAL_TABLET | Freq: Once | ORAL | Status: AC
  Administered 2020-08-31: 50 mg via ORAL
  Filled 2020-08-31: qty 2

## 2020-08-31 MED ORDER — HYDROXYZINE HCL 50 MG PO TABS: 50.0000 mg | ORAL_TABLET | Freq: Four times a day (QID) | ORAL | 1 refills | Status: DC | PRN

## 2020-08-31 NOTE — Discharge Instructions (Signed)
Please follow-up with your primary care provider regarding today's encounter.  I recommend that you be placed on SSRI or similar maintenance medication for your generalized anxiety.  I have prescribed you hydroxyzine which she can take as needed for symptomatic management.  Do not combine with antihistamines.  I recommend that you get plenty of exercise and have hobbies outside of videogames.  Your video games may be contributing to your right finger numbness.  Please wear the splint at night in the event that this is related to carpal tunnel syndrome.  You may ultimately benefit from orthopedic evaluation for possible decompression if your symptoms fail to improve with conservative therapy.  NSAIDs as needed for discomfort.  I also like for you to begin seeing a therapist.  This is necessary for management of your anxiety symptoms.  Return to the ED or seek immediate medical attention should you experience any new or worsening symptoms.

## 2020-08-31 NOTE — ED Provider Notes (Signed)
[facility] EMERGENCY DEPARTMENT Provider Note   CSN: [id number] Arrival date & time: 08/31/20  1001     History Chief Complaint  Patient presents with  . Panic Attack    [name] is a 54 y.o. male with PMH significant for HTN and generalized anxiety disorder who presents to the ED with complaints of anxiety attack.  I reviewed patient's medical record and he was evaluated in the ER recently for same complaints.  Patient states that he has had a 1-1/2-week history of tingling "numbness" sensation on the ulnar aspect of his second digit.  There is no numbness or tingling appreciated on the radial aspect.  He also states that it is distal to his PIP joint.  No other areas of numbness/tingling.  He believes that the symptoms are what has been contributing to his significant anxiety which is what prompted him to come to the ED again today.  He has not been able to sleep well at home.  He is pacing around the house.  He is anxious because he thinks he has had a stroke.  He denies any other deficits.  His mother is at bedside and states that he also recently lost a friend/family member which she believes is contributing to his anxiety symptoms.  When he has an attack, he feels "hot" with mild associated crampy abdominal discomfort/nausea.  No vomiting.  He denies any headache or dizziness, fevers or chills, dysarthria, chest pain or difficulty breathing, diaphoresis, difficulty ambulating, other numbness or weakness, or any other focal deficits.  Patient has a primary care provider.  He is wanting benzodiazepines, as advised by his mother.  He states that he sits at home and plays copious amounts of video games.  He does not have a job and has not worked for nearly 2 years.  He has never seen a therapist for his anxiety symptoms.  HPI     Past Medical History:  Diagnosis Date  . Arthritis   . Hypertension     There are no problems to display for this patient.   History  reviewed. No pertinent surgical history.     History reviewed. No pertinent family history.  Social History   Tobacco Use  . Smoking status: Former Games developer  . Smokeless tobacco: Never Used  Vaping Use  . Vaping Use: Never used  Substance Use Topics  . Alcohol use: No  . Drug use: Yes    Types: Marijuana    Home Medications Prior to Admission medications   Medication Sig Start Date End Date Taking? Authorizing Provider  AMLODIPINE BESYLATE PO Take by mouth.   Yes [provider]  HYDROCHLOROTHIAZIDE PO Take by mouth.   Yes [provider]  MELOXICAM PO Take by mouth.   Yes [provider]  fluconazole (DIFLUCAN) 200 MG tablet Take 1 tablet (200 mg total) by mouth daily. 03/15/20   Mesner, Barbara Cower, MD  hydrOXYzine (ATARAX/VISTARIL) 50 MG tablet Take 1 tablet (50 mg total) by mouth every 6 (six) hours as needed for anxiety. 08/31/20   Lorelee New, PA-C  IBUPROFEN PO Take by mouth.    [provider]  triamcinolone (KENALOG) 0.025 % ointment Apply 1 application topically 2 (two) times daily. 03/15/20   Mesner, Barbara Cower, MD    Allergies    Ace inhibitors  Review of Systems   Review of Systems  All other systems reviewed and are negative.   Physical Exam Updated Vital Signs BP (!) 153/99   Pulse Marland Kitchen)  101   Temp 98.7 F (37.1 C)   Resp 18   SpO2 100%   Physical Exam Vitals and nursing note reviewed. Exam conducted with a chaperone present.  HENT:     Head: Normocephalic and atraumatic.  Eyes:     General: No scleral icterus.    Conjunctiva/sclera: Conjunctivae normal.  Cardiovascular:     Rate and Rhythm: Normal rate and regular rhythm.     Pulses: Normal pulses.     Heart sounds: Normal heart sounds.  Pulmonary:     Effort: Pulmonary effort is normal. No respiratory distress.     Breath sounds: Normal breath sounds.  Abdominal:     General: Abdomen is flat. There is no distension.     Palpations: Abdomen is soft.     Tenderness:  There is no abdominal tenderness.  Musculoskeletal:     Comments: Right hand: Grip strength entirely intact.  Subjective numbness distal to PIP joint on ulnar aspect second digit.  Capillary refill less than 2 seconds.  Not feel cold.  No swelling or other overlying skin changes.  ROM and strength intact.  Radial pulses intact and symmetric.  Skin:    General: Skin is dry.     Capillary Refill: Capillary refill takes less than 2 seconds.  Neurological:     General: No focal deficit present.     Mental Status: He is alert and oriented to person, place, and time.     GCS: GCS eye subscore is 4. GCS verbal subscore is 5. GCS motor subscore is 6.     Cranial Nerves: No cranial nerve deficit.     Sensory: No sensory deficit.     Motor: No weakness.     Gait: Gait normal.     Comments: Mild subjective numbness over ulnar aspect second digit right hand distal to PIP.  Psychiatric:        Mood and Affect: Mood normal.        Behavior: Behavior normal.        Thought Content: Thought content normal.     ED Results / Procedures / Treatments   Labs (all labs ordered are listed, but only abnormal results are displayed) Labs Reviewed - No data to display  EKG None  Radiology No results found.  Procedures Procedures (including critical care time)  Medications Ordered in ED Medications  hydrOXYzine (ATARAX/VISTARIL) tablet 50 mg (50 mg Oral Given 08/31/20 1229)    ED Course  I have reviewed the triage vital signs and the nursing notes.  Pertinent labs & imaging results that were available during my care of the patient were reviewed by me and considered in my medical decision making (see chart for details).    MDM Rules/Calculators/A&P                          Patient presents the ED with anxiety attack accompanied by his mother.  He states that is mildly attributed to his recent numbness over his second finger, right hand.  He plays letter videogames and states that he has "burning"  discomfort when he first wakes up in the morning.  Suspect that there could be an element of carpal tunnel.  Positive Tinel's sign on exam.  Negative Phalen's test.  Will place in wrist splint to be worn at night.  Recommending NSAIDs as needed.  Treated patient with 50 mg of hydroxyzine here in the ED.  We will send him home with a  course of hydroxyzine for as needed abortive therapy.  Recommending that he follow-up with his primary care provider for maintenance therapy, recommending SSRI.  I discussed why benzodiazepines are not first-line therapy for anxiety symptoms.  I also encourage patient to seek a therapist as that is gold standard.  They deny any chest cough, shortness of breath, fevers or chills, or any other symptoms concerning for acute/emergent pathology.  No history of thyroid disorder.  No reported palpitations.  Patient and mother agree with plan.  ED return precautions discussed.  Patient and mother voiced understanding and are agreeable to the plan.   Final Clinical Impression(s) / ED Diagnoses Final diagnoses:  Anxiety about health  Numbness of finger    Rx / DC Orders ED Discharge Orders         Ordered    hydrOXYzine (ATARAX/VISTARIL) 50 MG tablet  Every 6 hours PRN,   Status:  Discontinued        08/31/20 1230    hydrOXYzine (ATARAX/VISTARIL) 50 MG tablet  Every 6 hours PRN        08/31/20 1231           Lorelee New, PA-C 08/31/20 1231    Jacalyn Lefevre, MD 08/31/20 1332

## 2020-08-31 NOTE — ED Triage Notes (Signed)
Pt here anxiety attack. Unable to sleep. Does not take anything for his anxiety and would like something prescribed today.

## 2021-07-03 IMAGING — DX DG CHEST 1V PORT
1 series · 1 of 1 positions shown · non-contrast
Comparison: 11/14/2018

CLINICAL DATA: Dyspnea

EXAM:
PORTABLE CHEST 1 VIEW

[chest ap]
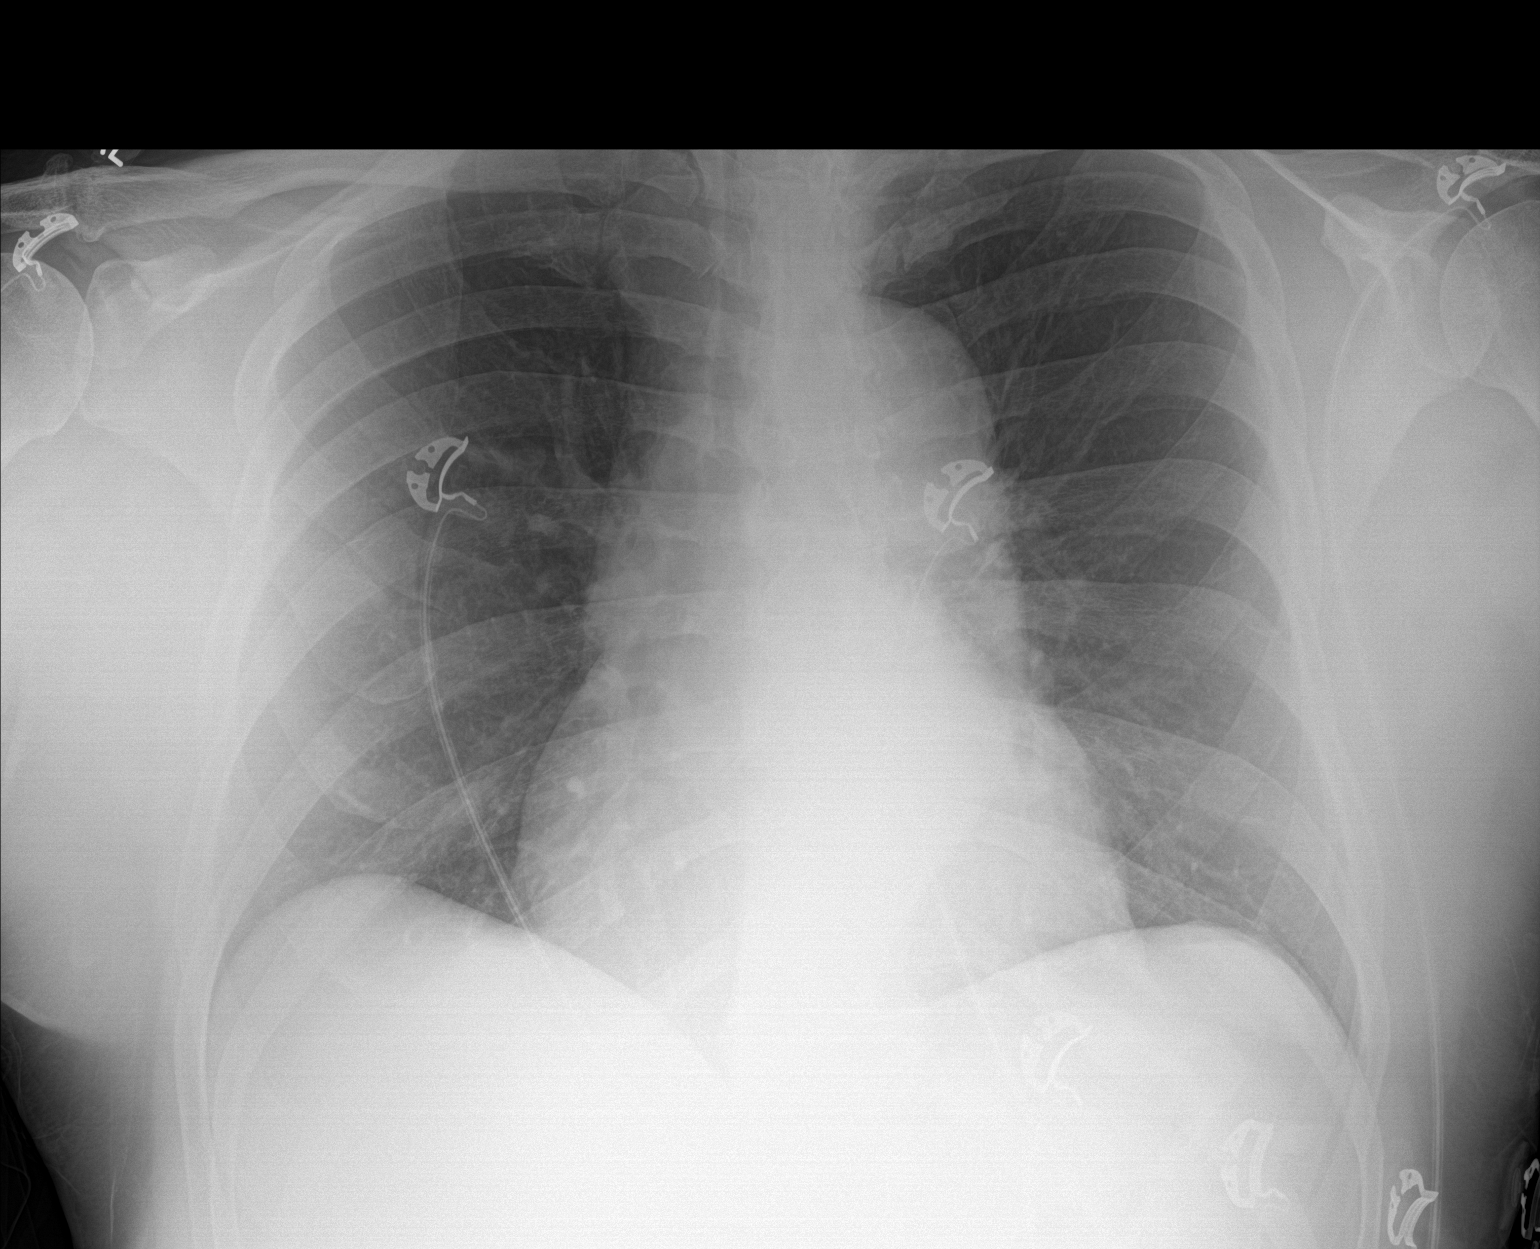

[1 of 1 positions shown; findings below may reference images not displayed]

FINDINGS: Lungs are clear. No pneumothorax or pleural effusion. Cardiac size
within normal limits. Mild ectasias of the thoracic aorta is likely
artifactual and related to semi upright positioning. Pulmonary
vascularity is normal.
IMPRESSION: No active disease.

## 2022-06-07 ENCOUNTER — Emergency Department (HOSPITAL_BASED_OUTPATIENT_CLINIC_OR_DEPARTMENT_OTHER): Payer: Self-pay

## 2022-06-07 ENCOUNTER — Other Ambulatory Visit: Payer: Self-pay

## 2022-06-07 ENCOUNTER — Encounter (HOSPITAL_BASED_OUTPATIENT_CLINIC_OR_DEPARTMENT_OTHER): Payer: Self-pay | Admitting: Emergency Medicine

## 2022-06-07 ENCOUNTER — Emergency Department (HOSPITAL_BASED_OUTPATIENT_CLINIC_OR_DEPARTMENT_OTHER)
Admission: EM | Admit: 2022-06-07 | Discharge: 2022-06-07 | Disposition: A | Discharge status: Home / Self Care | Payer: Self-pay | Attending: Emergency Medicine | Admitting: Emergency Medicine

## 2022-06-07 DIAGNOSIS — R1011 Right upper quadrant pain: Secondary | ICD-10-CM | POA: Insufficient documentation

## 2022-06-07 DIAGNOSIS — R61 Generalized hyperhidrosis: Secondary | ICD-10-CM | POA: Insufficient documentation

## 2022-06-07 DIAGNOSIS — A084 Viral intestinal infection, unspecified: Secondary | ICD-10-CM | POA: Insufficient documentation

## 2022-06-07 DIAGNOSIS — I10 Essential (primary) hypertension: Secondary | ICD-10-CM | POA: Insufficient documentation

## 2022-06-07 DIAGNOSIS — Z87891 Personal history of nicotine dependence: Secondary | ICD-10-CM | POA: Insufficient documentation

## 2022-06-07 DIAGNOSIS — Z79899 Other long term (current) drug therapy: Secondary | ICD-10-CM | POA: Insufficient documentation

## 2022-06-07 DIAGNOSIS — R7401 Elevation of levels of liver transaminase levels: Secondary | ICD-10-CM | POA: Insufficient documentation

## 2022-06-07 DIAGNOSIS — K760 Fatty (change of) liver, not elsewhere classified: Secondary | ICD-10-CM | POA: Insufficient documentation

## 2022-06-07 LAB — COMPREHENSIVE METABOLIC PANEL
ALT: 68 U/L — ABNORMAL HIGH (ref 0–44)
AST: 49 U/L — ABNORMAL HIGH (ref 15–41)
Albumin: 3.6 g/dL (ref 3.5–5.0)
Alkaline Phosphatase: 124 U/L (ref 38–126)
Anion gap: 9 (ref 5–15)
BUN: 13 mg/dL (ref 6–20)
CO2: 26 mmol/L (ref 22–32)
Calcium: 9 mg/dL (ref 8.9–10.3)
Chloride: 102 mmol/L (ref 98–111)
Creatinine, Ser: 0.98 mg/dL (ref 0.61–1.24)
GFR, Estimated: >60 mL/min (ref >60)
Glucose, Bld: 129 mg/dL — ABNORMAL HIGH (ref 70–99)
Potassium: 3 mmol/L — ABNORMAL LOW (ref 3.5–5.1)
Sodium: 137 mmol/L (ref 135–145)
Total Bilirubin: 0.5 mg/dL (ref 0.3–1.2)
Total Protein: 7.6 g/dL (ref 6.5–8.1)

## 2022-06-07 LAB — URINALYSIS, MICROSCOPIC (REFLEX)

## 2022-06-07 LAB — CBC WITH DIFFERENTIAL/PLATELET
Abs Immature Granulocytes: 0.04 10*3/uL (ref 0.00–0.07)
Basophils Absolute: 0 10*3/uL (ref 0.0–0.1)
Basophils Relative: 1 %
Eosinophils Absolute: 0.3 10*3/uL (ref 0.0–0.5)
Eosinophils Relative: 3 %
HCT: 40.6 % (ref 39.0–52.0)
Hemoglobin: 13.5 g/dL (ref 13.0–17.0)
Immature Granulocytes: 1 %
Lymphocytes Relative: 25 %
Lymphs Abs: 2.2 10*3/uL (ref 0.7–4.0)
MCH: 25.1 pg — ABNORMAL LOW (ref 26.0–34.0)
MCHC: 33.3 g/dL (ref 30.0–36.0)
MCV: 75.6 fL — ABNORMAL LOW (ref 80.0–100.0)
Monocytes Absolute: 0.8 10*3/uL (ref 0.1–1.0)
Monocytes Relative: 9 %
Neutro Abs: 5.5 10*3/uL (ref 1.7–7.7)
Neutrophils Relative %: 61 %
Platelets: 476 10*3/uL — ABNORMAL HIGH (ref 150–400)
RBC: 5.37 MIL/uL (ref 4.22–5.81)
RDW: 16 % — ABNORMAL HIGH (ref 11.5–15.5)
WBC: 8.8 10*3/uL (ref 4.0–10.5)
nRBC: 0 % (ref 0.0–0.2)

## 2022-06-07 LAB — URINALYSIS, ROUTINE W REFLEX MICROSCOPIC
Bilirubin Urine: NEGATIVE
Glucose, UA: NEGATIVE mg/dL
Ketones, ur: NEGATIVE mg/dL
Leukocytes,Ua: NEGATIVE
Nitrite: NEGATIVE
Protein, ur: NEGATIVE mg/dL
Specific Gravity, Urine: 1.015 (ref 1.005–1.030)
pH: 7.5 (ref 5.0–8.0)

## 2022-06-07 LAB — LIPASE, BLOOD: Lipase: 30 U/L (ref 11–51)

## 2022-06-07 MED ORDER — ONDANSETRON HCL 4 MG/2ML IJ SOLN
4.0000 mg | Freq: Once | INTRAMUSCULAR | Status: AC
  Administered 2022-06-07: 4 mg via INTRAVENOUS
  Filled 2022-06-07: qty 2

## 2022-06-07 MED ORDER — ONDANSETRON HCL 4 MG PO TABS: 4.0000 mg | ORAL_TABLET | Freq: Four times a day (QID) | ORAL | 0 refills | Status: DC

## 2022-06-07 MED ORDER — LACTATED RINGERS IV BOLUS
1000.0000 mL | Freq: Once | INTRAVENOUS | Status: AC
  Administered 2022-06-07: 1000 mL via INTRAVENOUS

## 2022-06-07 NOTE — ED Provider Notes (Signed)
[facility] EMERGENCY DEPARTMENT Provider Note   CSN: [id number] Arrival date & time: 06/07/22  0850     History  Chief Complaint  Patient presents with   Fatigue    Zachary Flowers is a 56 y.o. male with history of hypertension, anxiety who presents emergency department for evaluation of numerous complaints including fatigue, cold sweats, vomiting and diarrhea for 5 days.  Patient states that at home, he intermittently feels hot and then cold and is constantly sweating.  He also feels slightly weak and lightheaded.  He has had diarrhea for 5 days including today.  He also complains of nausea and has numerous episodes of vomiting most recently yesterday.  His appetite has been down although he is able to tolerate p.o. fluids.  He is also noting some RUQ pain.  He states that in the past he has been told that he may need to have his gallbladder taken out, however at the time he was prescribed an antibiotic which resolved his symptoms and surgery was not necessary.  Currently, he thought his pain was more related to his frequent vomiting and that he may have pulled a muscle.  It hurts only on palpation and not at rest.  He has been taking over-the-counter Tylenol Cold and flu without any symptomatic improvement.  He denies chest pain, shortness of breath, cough, congestion, numbness.  No recent travel, no new sick contacts.  HPI     Home Medications Prior to Admission medications   Medication Sig Start Date End Date Taking? Authorizing Provider  ondansetron (ZOFRAN) 4 MG tablet Take 1 tablet (4 mg total) by mouth every 6 (six) hours. 06/07/22  Yes Raynald Blend R, PA-C  AMLODIPINE BESYLATE PO Take by mouth.    [provider]  fluconazole (DIFLUCAN) 200 MG tablet Take 1 tablet (200 mg total) by mouth daily. 03/15/20   Mesner, Barbara Cower, MD  HYDROCHLOROTHIAZIDE PO Take by mouth.    [provider]  hydrOXYzine (ATARAX/VISTARIL) 50 MG tablet Take 1 tablet (50 mg total) by  mouth every 6 (six) hours as needed for anxiety. 08/31/20   Lorelee New, PA-C  IBUPROFEN PO Take by mouth.    [provider]  MELOXICAM PO Take by mouth.    [provider]  triamcinolone (KENALOG) 0.025 % ointment Apply 1 application topically 2 (two) times daily. 03/15/20   Mesner, Barbara Cower, MD      Allergies    Ace inhibitors    Review of Systems   Review of Systems  Physical Exam Updated Vital Signs BP 134/80   Pulse 85   Temp 98.9 F (37.2 C) (Oral)   Resp 17   Ht 5\' 11"  (1.803 m)   Wt 95.3 kg   SpO2 95%   BMI 29.29 kg/m  Physical Exam Vitals and nursing note reviewed.  Constitutional:      General: He is not in acute distress.    Appearance: He is diaphoretic. He is not ill-appearing.     Comments: Patient lying in bed in no acute distress, fanning himself  HENT:     Head: Atraumatic.     Right Ear: Tympanic membrane normal.     Left Ear: Tympanic membrane normal.     Nose: No congestion.  Eyes:     Conjunctiva/sclera: Conjunctivae normal.  Cardiovascular:     Rate and Rhythm: Normal rate and regular rhythm.     Pulses: Normal pulses.          Radial pulses are  2+ on the right side and 2+ on the left side.     Heart sounds: No murmur heard. Pulmonary:     Effort: Pulmonary effort is normal. No respiratory distress.     Breath sounds: Normal breath sounds.  Abdominal:     General: There is no distension.     Palpations: Abdomen is soft.     Tenderness: There is abdominal tenderness. There is no right CVA tenderness or left CVA tenderness.     Comments: Right upper quadrant tenderness without Murphy sign, negative McBurney  Musculoskeletal:        General: Normal range of motion.     Cervical back: Normal range of motion.     Right lower leg: No edema.     Left lower leg: No edema.  Skin:    General: Skin is warm.     Capillary Refill: Capillary refill takes less than 2 seconds.  Neurological:     General: No focal deficit present.      Mental Status: He is alert.  Psychiatric:        Mood and Affect: Mood normal.     ED Results / Procedures / Treatments   Labs (all labs ordered are listed, but only abnormal results are displayed) Labs Reviewed  COMPREHENSIVE METABOLIC PANEL - Abnormal; Notable for the following components:      Result Value   Potassium 3.0 (*)    Glucose, Bld 129 (*)    AST 49 (*)    ALT 68 (*)    All other components within normal limits  CBC WITH DIFFERENTIAL/PLATELET - Abnormal; Notable for the following components:   MCV 75.6 (*)    MCH 25.1 (*)    RDW 16.0 (*)    Platelets 476 (*)    All other components within normal limits  URINALYSIS, ROUTINE W REFLEX MICROSCOPIC - Abnormal; Notable for the following components:   Hgb urine dipstick TRACE (*)    All other components within normal limits  URINALYSIS, MICROSCOPIC (REFLEX) - Abnormal; Notable for the following components:   Bacteria, UA FEW (*)    All other components within normal limits  LIPASE, BLOOD    EKG EKG Interpretation  Date/Time:  Sunday June 07 2022 09:01:53 EDT Ventricular Rate:  88 PR Interval:  156 QRS Duration: 96 QT Interval:  387 QTC Calculation: 469 R Axis:   -1 Text Interpretation: Sinus rhythm Left ventricular hypertrophy since last tracing no significant change Confirmed by Rolan Bucco [phone]) on 06/07/2022 11:16:01 AM  Radiology US Abdomen Limited RUQ (LIVER/GB)  Result Date: 06/07/2022 CLINICAL DATA:  RIGHT quadrant x3 days.  N/V. EXAM: ULTRASOUND ABDOMEN LIMITED RIGHT UPPER QUADRANT COMPARISON:  Chest XR, 08/29/2020. FINDINGS: Gallbladder: No gallstones or wall thickening visualized. No sonographic Murphy sign noted by sonographer. Common bile duct: Diameter: 0.2 cm Liver: No focal lesion identified. Increased hepatic parenchymal echogenicity. Portal vein is patent on color Doppler imaging with normal direction of blood flow towards the liver. Other: No perihepatic ascites. IMPRESSION: 1. No acute  sonographic findings within the RIGHT upper quadrant 2. Echogenic liver. Findings most commonly seen in hepatic steatosis, though may also represent hepatitis and/or fibrosis. Electronically Signed   By: Roanna Banning M.D.   On: 06/07/2022 10:16    Procedures Procedures    Medications Ordered in ED Medications  lactated ringers bolus 1,000 mL (1,000 mLs Intravenous New Bag/Given 06/07/22 0942)  ondansetron (ZOFRAN) injection 4 mg (4 mg Intravenous Given 06/07/22 0941)    ED Course/  Medical Decision Making/ A&P                           Medical Decision Making Amount and/or Complexity of Data Reviewed Labs: ordered. Radiology: ordered.  Risk Prescription drug management.   Social determinants of health:  Social History   Socioeconomic History   Marital status: Married    Spouse name: Not on file   Number of children: Not on file   Years of education: Not on file   Highest education level: Not on file  Occupational History   Not on file  Tobacco Use   Smoking status: Former   Smokeless tobacco: Never  Vaping Use   Vaping Use: Never used  Substance and Sexual Activity   Alcohol use: No   Drug use: Yes    Types: Marijuana   Sexual activity: Not on file  Other Topics Concern   Not on file  Social History Narrative   Not on file   Social Determinants of Health   Financial Resource Strain: Not on file  Food Insecurity: Not on file  Transportation Needs: Not on file  Physical Activity: Not on file  Stress: Not on file  Social Connections: Not on file  Intimate Partner Violence: Not on file     Initial impression:  This patient presents to the ED for concern of weakness, vomiting and diarrhea for 5 days, this involves an extensive number of treatment options, and is a complaint that carries with it a high risk of complications and morbidity.   Differentials include cholecystitis, cholelithiasis, choledocholithiasis, viral gastroenteritis, pancreatitis.    Comorbidities affecting care:  Hypertension, anxiety  Additional history obtained: Mother at bedside  Lab Tests  I Ordered, reviewed, and interpreted labs and EKG.  The pertinent results include:  CBC unremarkable Potassium 3.0, LFTs mildly elevated UA without evidence of infection Normal lipase  Imaging Studies ordered:  I ordered imaging studies including  RUQ ultrasound without evidence of gallbladder pathology although there are signs of hepatic steatosis I independently visualized and interpreted imaging and I agree with the radiologist interpretation.   EKG: Normal sinus rhythm  Cardiac Monitoring:  The patient was maintained on a cardiac monitor.  I personally viewed and interpreted the cardiac monitored which showed an underlying rhythm of: Normal sinus rhythm   Medicines ordered and prescription drug management:  I ordered medication including: 1 L LR bolus Zofran 4 mg IV Reevaluation of the patient after these medicines showed that the patient improved I have reviewed the patients home medicines and have made adjustments as needed   ED Course/Re-evaluation: Patient appears in no acute distress and is nontoxic-appearing.  He is slightly diaphoretic and fanning himself with a hand-held fan for his cold sweats.  Vitals are without significant abnormality.  He is satting well on room air.  He has mild right upper quadrant tenderness without Murphy sign.  Abdomen is otherwise rounded, nontender nondistended.  Negative CVA tenderness bilaterally.  Given his 5 days of vomiting and diarrhea, he was given 1 L fluid bolus with antiemetics.   On reevaluation, he was feeling much better.  His labs returned without acute findings.  There is no leukocytosis, lipase was normal, no urinary tract infection.  His potassium was slightly low at 3.0, likely from his vomiting and diarrhea.  He takes a daily potassium supplement at home and I advised him to take an additional dose of  that here today.  It was noted  that his AST and ALT were very mildly elevated.  I ordered a RUQ ultrasound which was without gallbladder pathology, however concerning for fatty liver disease.  I discussed these findings with patient; he denies frequent or regular alcohol usage although does have a diet heavy in fried foods/fast foods and red meat.  Advised new dietary changes and follow-up with his PCP to monitor for progression of disease.  Patient expresses understanding is amenable to plan.  Overall, I think this is unrelated to his current symptoms of vomiting and diarrhea he is likely suffering from a viral gastroenteritis.  Plan to discharge home with Zofran to use for nausea and he can pick up Imodium OTC to help with diarrhea.  Recommend plenty of fluids.  Patient and mother expressed understanding. Disposition:  After consideration of the diagnostic results, physical exam, history and the patients response to treatment feel that the patent would benefit from discharge.   Viral gastroenteritis Hepatic steatosis: Plan and management as described above. Discharged home in good condition. Final Clinical Impression(s) / ED Diagnoses Final diagnoses:  Viral gastroenteritis  Hepatic steatosis    Rx / DC Orders ED Discharge Orders          Ordered    ondansetron (ZOFRAN) 4 MG tablet  Every 6 hours        06/07/22 1117              Janell Quiet, [place] 06/07/22 1123    Rolan Bucco, MD 06/07/22 1526

## 2022-06-07 NOTE — Discharge Instructions (Signed)
Your symptoms are most consistent with a viral gastroenteritis, otherwise known as a stomach bug.  To help with your vomiting I have sent you in a medication called Zofran that you can dissolve under your tongue if you are feeling nauseous.  Additionally, for your diarrhea, you can take over-the-counter medication called Imodium until your symptoms have resolved.  You should start feeling better in a few days.  Fortunately, your gallbladder was without signs of infection or gallstones today.  However what was noted is that you do have evidence of fatty liver disease.  Your liver enzymes were just mildly elevated.  I have attached some information here for you regarding dietary changes that you can implement to help prevent worsening disease, however would like you to follow-up with your primary care physician for close monitoring going forward.

## 2022-06-07 NOTE — ED Triage Notes (Signed)
Pt arrives pov, steady gait, c/o fatigue, RUQ pain nausea and intermittent diaphoresis x 6 days. Denies fever, denies cough

## 2022-10-06 ENCOUNTER — Ambulatory Visit: Payer: Commercial Managed Care - HMO | Admitting: Internal Medicine

## 2022-10-06 ENCOUNTER — Encounter: Payer: Self-pay | Admitting: Internal Medicine

## 2022-10-06 VITALS — BP 143/101 | HR 87 | Ht 71.0 in | Wt 207.0 lb

## 2022-10-06 DIAGNOSIS — I10 Essential (primary) hypertension: Secondary | ICD-10-CM | POA: Insufficient documentation

## 2022-10-06 DIAGNOSIS — E782 Mixed hyperlipidemia: Secondary | ICD-10-CM

## 2022-10-06 DIAGNOSIS — E119 Type 2 diabetes mellitus without complications: Secondary | ICD-10-CM

## 2022-10-06 MED ORDER — LOSARTAN POTASSIUM 25 MG PO TABS: 25.0000 mg | ORAL_TABLET | Freq: Every day | ORAL | 3 refills | Status: DC

## 2022-10-06 MED ORDER — LOSARTAN POTASSIUM 25 MG PO TABS: 25.0000 mg | ORAL_TABLET | Freq: Every day | ORAL | 3 refills | Status: AC

## 2022-10-06 NOTE — Progress Notes (Signed)
Primary Physician/Referring:  Patient, No Pcp Per  Patient ID: Zachary Flowers, male    DOB: March 06, 1966, 56 y.o.   MRN: [id number]  Chief Complaint  Patient presents with   Hypertension   Abnormal ECG   New Patient (Initial Visit)   HPI:    Zachary Flowers  is a 56 y.o. male with past medical history significant for hypertension, hyperlipidemia, and diabetes who is here to establish care with cardiology.  Patient has had difficult to control blood pressure for quite some time now.  Despite his current medications, his blood pressure is still uncontrolled.  Patient does not smoke or drink alcohol.  He has never had a stress test or an echocardiogram before.  On his EKG there is evidence of left ventricular hypertrophy from uncontrolled blood pressure.  He denies chest pain, shortness of breath, palpitations, diaphoresis, syncope, claudication, edema, orthopnea, PND.  Past Medical History:  Diagnosis Date   Arthritis    Hypertension    History reviewed. No pertinent surgical history. History reviewed. No pertinent family history.  Social History   Tobacco Use   Smoking status: Former   Smokeless tobacco: Never  Substance Use Topics   Alcohol use: No   Marital Status: Married  ROS  Review of Systems  Cardiovascular:  Positive for dyspnea on exertion and irregular heartbeat.   Objective  Blood pressure (!) 143/101, pulse 87, height 5\' 11"  (1.803 m), weight 207 lb (93.9 kg), SpO2 97 %. Body mass index is 28.87 kg/m.     10/06/2022    1:34 PM 10/06/2022    1:33 PM 06/07/2022   11:30 AM  Vitals with BMI  Height  5\' 11"    Weight  207 lbs   BMI  96.78   Systolic 938 101 751  Diastolic 025 852 91  Pulse 87 88 80     Physical Exam Vitals reviewed.  HENT:     Head: Normocephalic and atraumatic.  Cardiovascular:     Rate and Rhythm: Normal rate and regular rhythm.     Pulses: Normal pulses.     Heart sounds: Normal heart sounds. No murmur heard. Pulmonary:     Effort:  Pulmonary effort is normal.     Breath sounds: Normal breath sounds.  Abdominal:     General: Bowel sounds are normal.  Musculoskeletal:     Right lower leg: No edema.     Left lower leg: No edema.  Skin:    General: Skin is warm and dry.  Neurological:     Mental Status: He is alert.     Medications and allergies   Allergies  Allergen Reactions   Ace Inhibitors      Medication list after today's encounter   Current Outpatient Medications:    amLODipine (NORVASC) 10 MG tablet, Take 10 mg by mouth daily., Disp: , Rfl:    atorvastatin (LIPITOR) 20 MG tablet, Take 20 mg by mouth daily., Disp: , Rfl:    hydrochlorothiazide (HYDRODIURIL) 25 MG tablet, Take 1 tablet by mouth daily., Disp: , Rfl:    MELOXICAM PO, Take by mouth., Disp: , Rfl:    metFORMIN (GLUCOPHAGE) 500 MG tablet, Take 500 mg by mouth daily., Disp: , Rfl:    sertraline (ZOLOFT) 50 MG tablet, Take 50 mg by mouth daily., Disp: , Rfl:    tiZANidine (ZANAFLEX) 4 MG tablet, Take 4 mg by mouth 3 (three) times daily., Disp: , Rfl:    triamcinolone (KENALOG) 0.025 % ointment, Apply 1 application topically 2 (  two) times daily., Disp: 454 g, Rfl: 0   AMLODIPINE BESYLATE PO, Take by mouth., Disp: , Rfl:   Laboratory examination:   Lab Results  Component Value Date   NA 137 06/07/2022   K 3.0 (L) 06/07/2022   CO2 26 06/07/2022   GLUCOSE 129 (H) 06/07/2022   BUN 13 06/07/2022   CREATININE 0.98 06/07/2022   CALCIUM 9.0 06/07/2022   GFRNONAA >60 06/07/2022       Latest Ref Rng & Units 06/07/2022    9:28 AM  CMP  Glucose 70 - 99 mg/dL 102   BUN 6 - 20 mg/dL 13   Creatinine 5.85 - 1.24 mg/dL 2.77   Sodium 824 - 235 mmol/L 137   Potassium 3.5 - 5.1 mmol/L 3.0   Chloride 98 - 111 mmol/L 102   CO2 22 - 32 mmol/L 26   Calcium 8.9 - 10.3 mg/dL 9.0   Total Protein 6.5 - 8.1 g/dL 7.6   Total Bilirubin 0.3 - 1.2 mg/dL 0.5   Alkaline Phos 38 - 126 U/L 124   AST 15 - 41 U/L 49   ALT 0 - 44 U/L 68       Latest Ref Rng  & Units 06/07/2022    9:28 AM  CBC  WBC 4.0 - 10.5 K/uL 8.8   Hemoglobin 13.0 - 17.0 g/dL 36.1   Hematocrit 44.3 - 52.0 % 40.6   Platelets 150 - 400 K/uL 476     Lipid Panel No results for input(s): "CHOL", "TRIG", "LDLCALC", "VLDL", "HDL", "CHOLHDL", "LDLDIRECT" in the last 8760 hours.  HEMOGLOBIN A1C No results found for: "HGBA1C", "MPG" TSH No results for input(s): "TSH" in the last 8760 hours.  External labs:     Radiology:    Cardiac Studies:   No results found for this or any previous visit from the past 1095 days.     No results found for this or any previous visit from the past 1095 days.     EKG:   10/06/2022 Sinus Rhythm, Left atrial enlargement. LVH and Nonspecific ST depression.  Assessment     ICD-10-CM   1. Primary hypertension  I10 EKG 12-Lead    2. Mixed hyperlipidemia  E78.2     3. Type 2 diabetes mellitus without complication, without long-term current use of insulin (HCC)  E11.9        Orders Placed This Encounter  Procedures   EKG 12-Lead    No orders of the defined types were placed in this encounter.   Medications Discontinued During This Encounter  Medication Reason   fluconazole (DIFLUCAN) 200 MG tablet    HYDROCHLOROTHIAZIDE PO    hydrOXYzine (ATARAX/VISTARIL) 50 MG tablet    IBUPROFEN PO    ondansetron (ZOFRAN) 4 MG tablet      Recommendations:   Zachary Flowers is a 56 y.o.  male with HTN, HLD, and DM2   Primary hypertension Continue current cardiac medications. Encourage low-sodium diet, less than 2000 mg daily. Echocardiogram and stress test ordered Losartan 25mg  qHS added to regiment Follow-up in 1-2 months or sooner if needed.   Mixed hyperlipidemia Continue lipitor   Type 2 diabetes mellitus without complication, without long-term current use of insulin [facility]) Continue metformin     [facility], DO, [facility]  10/06/2022, 1:46 PM Office: [phone] Pager: [phone]

## 2022-10-12 ENCOUNTER — Ambulatory Visit: Payer: Commercial Managed Care - HMO

## 2022-10-12 DIAGNOSIS — E119 Type 2 diabetes mellitus without complications: Secondary | ICD-10-CM

## 2022-10-12 DIAGNOSIS — E782 Mixed hyperlipidemia: Secondary | ICD-10-CM

## 2022-10-12 DIAGNOSIS — I10 Essential (primary) hypertension: Secondary | ICD-10-CM

## 2022-10-20 ENCOUNTER — Other Ambulatory Visit: Payer: Commercial Managed Care - HMO

## 2022-10-26 ENCOUNTER — Telehealth: Payer: Self-pay

## 2022-10-26 ENCOUNTER — Encounter: Payer: Self-pay | Admitting: Family Medicine

## 2022-10-26 ENCOUNTER — Ambulatory Visit: Payer: Commercial Managed Care - HMO | Admitting: Family Medicine

## 2022-10-26 VITALS — BP 138/78 | Ht 71.0 in | Wt 210.0 lb

## 2022-10-26 DIAGNOSIS — M170 Bilateral primary osteoarthritis of knee: Secondary | ICD-10-CM | POA: Diagnosis not present

## 2022-10-26 DIAGNOSIS — M7551 Bursitis of right shoulder: Secondary | ICD-10-CM | POA: Diagnosis not present

## 2022-10-26 DIAGNOSIS — M7582 Other shoulder lesions, left shoulder: Secondary | ICD-10-CM | POA: Insufficient documentation

## 2022-10-26 NOTE — Patient Instructions (Signed)
Nice to meet you Please use ice as needed on the knees  Please use heat as needed on the shoulder  Please try the exercises  I have made a referral to physical therapy   Please send me a message in MyChart with any questions or updates.  Please see me back to complete the zilretta injections.   --Dr. Jordan Likes

## 2022-10-26 NOTE — Telephone Encounter (Signed)
Spoke with patient about bilateral Zilretta injections. Benefits ran in [employer], no PA or referrals needed. Patient is responsible for $15 copay, with the remaining covered at 100% by payer. Only one copay applies per date of service.   Patient scheduled for 11/03/22

## 2022-10-26 NOTE — Assessment & Plan Note (Signed)
Acutely occurring. Has good range of motion  - counseled on home exercise therapy and supportive care - referral to PT  - could consider imaging or injection.

## 2022-10-26 NOTE — Assessment & Plan Note (Signed)
Acute on chronic in nature. Previous xrays have shown degenerative changes.  - counseled on home exercise therapy and supportive care - pursue zilretta injections  - referral to PT  - could consider further imaging

## 2022-10-26 NOTE — Progress Notes (Signed)
  Zachary Flowers - 56 y.o. male MRN [id number]  Date of birth: [birth year]  SUBJECTIVE:  Including CC & ROS.  No chief complaint on file.   Zachary Flowers is a 56 y.o. male that is  presenting with acute on chronic bilateral knee pain. Has tried steroid injections in the past. Recently, his shoulders have started hurting. Localized to the joint. No injury.    Review of Systems See HPI   HISTORY: Past Medical, Surgical, Social, and Family History Reviewed & Updated per EMR.   Pertinent Historical Findings include:  Past Medical History:  Diagnosis Date   Arthritis    Hypertension     History reviewed. No pertinent surgical history.   PHYSICAL EXAM:  VS: BP 138/78   Ht 5\' 11"  (1.803 m)   Wt 210 lb (95.3 kg)   BMI 29.29 kg/m  Physical Exam Gen: NAD, alert, cooperative with exam, well-appearing MSK:  Neurovascularly intact       ASSESSMENT & PLAN:   Tendinitis of left rotator cuff Acutely occurring. No weakness.  - counseled on home exercise therapy and supportive care - referral to PT  - could consider injection or nitro  Subacromial bursitis of right shoulder joint Acutely occurring. Has good range of motion  - counseled on home exercise therapy and supportive care - referral to PT  - could consider imaging or injection.   Primary osteoarthritis of both knees Acute on chronic in nature. Previous xrays have shown degenerative changes.  - counseled on home exercise therapy and supportive care - pursue zilretta injections  - referral to PT  - could consider further imaging

## 2022-10-26 NOTE — Assessment & Plan Note (Signed)
Acutely occurring. No weakness.  - counseled on home exercise therapy and supportive care - referral to PT  - could consider injection or nitro

## 2022-11-03 ENCOUNTER — Encounter: Payer: Self-pay | Admitting: Family Medicine

## 2022-11-03 ENCOUNTER — Ambulatory Visit: Payer: Self-pay

## 2022-11-03 ENCOUNTER — Ambulatory Visit: Payer: Commercial Managed Care - HMO | Admitting: Family Medicine

## 2022-11-03 VITALS — BP 136/86 | Ht 71.0 in | Wt 210.0 lb

## 2022-11-03 DIAGNOSIS — M170 Bilateral primary osteoarthritis of knee: Secondary | ICD-10-CM | POA: Diagnosis not present

## 2022-11-03 MED ORDER — TRIAMCINOLONE ACETONIDE 32 MG IX SRER
32.0000 mg | Freq: Once | INTRA_ARTICULAR | Status: AC
  Administered 2022-11-03: 32 mg via INTRA_ARTICULAR

## 2022-11-03 NOTE — Patient Instructions (Signed)
Good to see you Please use ice as needed   Please send me a message in MyChart with any questions or updates.  Please see me back in 4-6 weeks.   --Dr. Wesly Whisenant  

## 2022-11-03 NOTE — Progress Notes (Signed)
  Sukhman Martine - 56 y.o. male MRN [id number]  Date of birth: [birth year]  SUBJECTIVE:  Including CC & ROS.  No chief complaint on file.   Johne Buckle is a 56 y.o. male that is  here for bilateral zilretta injections.     Review of Systems See HPI   HISTORY: Past Medical, Surgical, Social, and Family History Reviewed & Updated per EMR.   Pertinent Historical Findings include:  Past Medical History:  Diagnosis Date   Arthritis    Hypertension     History reviewed. No pertinent surgical history.   PHYSICAL EXAM:  VS: BP 136/86   Ht 5\' 11"  (1.803 m)   Wt 210 lb (95.3 kg)   BMI 29.29 kg/m  Physical Exam Gen: NAD, alert, cooperative with exam, well-appearing MSK:  Neurovascularly intact     Aspiration/Injection Procedure Note Quantae Martel 1966-12-03  Procedure: Injection Indications: right and left knee pain  Procedure Details Consent: Risks of procedure as well as the alternatives and risks of each were explained to the (patient/caregiver).  Consent for procedure obtained. Time Out: Verified patient identification, verified procedure, site/side was marked, verified correct patient position, special equipment/implants available, medications/allergies/relevent history reviewed, required imaging and test results available.  Performed.  The area was cleaned with iodine and alcohol swabs.    The right and left knee superior lateral suprapatellar pouch was injected using 3 cc of 1% lidocaine on a 25-gauge 1-1/2 inch needle.  An 18-gauge 1-1/2 needle was used to achieve aspiration.  The syringe was switched and a mixture containing 5 cc's of 32 mg Zilretta and 4 cc's of 0.25% bupivacaine was injected.  Ultrasound was used. Images were obtained in long views showing the injection.    A sterile dressing was applied.  Patient did tolerate procedure well.     ASSESSMENT & PLAN:   Primary osteoarthritis of both knees Completed bilateral zilretta injections.

## 2022-11-03 NOTE — Assessment & Plan Note (Signed)
Completed bilateral zilretta injections.  

## 2022-11-10 ENCOUNTER — Ambulatory Visit: Payer: Commercial Managed Care - HMO

## 2022-11-10 ENCOUNTER — Other Ambulatory Visit: Payer: Commercial Managed Care - HMO

## 2022-11-10 DIAGNOSIS — R9431 Abnormal electrocardiogram [ECG] [EKG]: Secondary | ICD-10-CM

## 2022-11-10 DIAGNOSIS — E782 Mixed hyperlipidemia: Secondary | ICD-10-CM

## 2022-11-10 DIAGNOSIS — E119 Type 2 diabetes mellitus without complications: Secondary | ICD-10-CM

## 2022-11-10 DIAGNOSIS — I10 Essential (primary) hypertension: Secondary | ICD-10-CM

## 2022-11-17 ENCOUNTER — Ambulatory Visit: Payer: Commercial Managed Care - HMO | Admitting: Internal Medicine

## 2022-11-17 ENCOUNTER — Encounter: Payer: Self-pay | Admitting: Internal Medicine

## 2022-11-17 DIAGNOSIS — I10 Essential (primary) hypertension: Secondary | ICD-10-CM

## 2022-11-17 NOTE — Progress Notes (Signed)
Attempted to call, no answer.  11/17/2022: Patient answered. He is doing well. No complaints or concerns. He was having car trouble. Will follow-up in 6 months. BP has been well controlled per patient.

## 2022-12-25 ENCOUNTER — Emergency Department (HOSPITAL_BASED_OUTPATIENT_CLINIC_OR_DEPARTMENT_OTHER)
Admission: EM | Admit: 2022-12-25 | Discharge: 2022-12-25 | Disposition: A | Discharge status: Home / Self Care | Payer: Commercial Managed Care - HMO | Attending: Emergency Medicine | Admitting: Emergency Medicine

## 2022-12-25 ENCOUNTER — Other Ambulatory Visit: Payer: Self-pay

## 2022-12-25 DIAGNOSIS — R21 Rash and other nonspecific skin eruption: Secondary | ICD-10-CM | POA: Insufficient documentation

## 2022-12-25 DIAGNOSIS — I10 Essential (primary) hypertension: Secondary | ICD-10-CM | POA: Insufficient documentation

## 2022-12-25 DIAGNOSIS — Z79899 Other long term (current) drug therapy: Secondary | ICD-10-CM | POA: Diagnosis not present

## 2022-12-25 DIAGNOSIS — E119 Type 2 diabetes mellitus without complications: Secondary | ICD-10-CM | POA: Diagnosis not present

## 2022-12-25 MED ORDER — PREDNISONE 50 MG PO TABS: 50.0000 mg | ORAL_TABLET | Freq: Every day | ORAL | 0 refills | Status: DC

## 2022-12-25 NOTE — ED Provider Notes (Signed)
[facility] Provider Note   CSN: [id number] Arrival date & time: 12/25/22  1456     History  Chief Complaint  Patient presents with   Rash    Zachary Flowers is a 57 y.o. male with Hx of HTN, hyperlipidemia, DMT2, osteoarthritis presenting to the ED today with rash to bilateral forearms and left flank.  Reports similar rash in the past that responded well to prednisone.  States the rash is pruritic and usually appears after changing detergents, which he admits he started new detergent last week.  No new foods or medications prior to onset.  Rash first appeared 1 week ago.  Was given topical ointment by PCP without improvement.  Patient currently in the process of rewashing all of his close and switching back to his previous Psychologist, sport and exercise.  Denies fever, mucosal involvement, chills, N/V/D, or difficulty swallowing.  Tried scheduling follow-up with PCP for course of prednisone, however has not received contact back.  Has not followed up with dermatology in a long time, states has not had a reaction like this in 5 to 10 years.  No other complaints at this time.  The history is provided by the patient and medical records.  Rash     Home Medications Prior to Admission medications   Medication Sig Start Date End Date Taking? Authorizing Provider  predniSONE (DELTASONE) 50 MG tablet Take 1 tablet (50 mg total) by mouth daily. 12/25/22  Yes Prince Rome, PA-C  amLODipine (NORVASC) 10 MG tablet Take 10 mg by mouth daily.    [provider]  atorvastatin (LIPITOR) 20 MG tablet Take 20 mg by mouth daily. 09/29/22   [provider]  diclofenac (VOLTAREN) 75 MG EC tablet Take 75 mg by mouth 2 (two) times daily. 10/19/22   [provider]  hydrochlorothiazide (HYDRODIURIL) 25 MG tablet Take 1 tablet by mouth daily.    [provider]  losartan (COZAAR) 25 MG tablet Take 1 tablet (25 mg total) by mouth at bedtime. 10/06/22  01/04/23  Custovic, Collene Mares, DO  sertraline (ZOLOFT) 50 MG tablet Take 50 mg by mouth daily. 09/14/22   [provider]  tiZANidine (ZANAFLEX) 4 MG tablet Take 4 mg by mouth 3 (three) times daily. 09/24/22   [provider]      Allergies    Ace inhibitors    Review of Systems   Review of Systems  Skin:  Positive for rash.    Physical Exam Updated Vital Signs BP (!) 136/96 (BP Location: Right Arm)   Pulse 95   Temp 98 F (36.7 C) (Oral)   Resp 16   SpO2 99%  Physical Exam Vitals and nursing note reviewed.  Constitutional:      General: He is not in acute distress.    Appearance: He is well-developed. He is not ill-appearing, toxic-appearing or diaphoretic.  HENT:     Head: Normocephalic and atraumatic.     Comments: No facial rash appreciated    Mouth/Throat:     Mouth: Mucous membranes are moist.     Pharynx: Oropharynx is clear.     Comments: No rash appreciated of oral mucosa.  No angioedema. Eyes:     Conjunctiva/sclera: Conjunctivae normal.  Cardiovascular:     Rate and Rhythm: Normal rate and regular rhythm.     Heart sounds: No murmur heard. Pulmonary:     Effort: Pulmonary effort is normal. No respiratory distress.     Breath sounds: Normal breath sounds.  Comments: CTAB, able to communicate without difficulty, without increased respiratory effort. Abdominal:     Palpations: Abdomen is soft.     Tenderness: There is no abdominal tenderness.  Musculoskeletal:        General: No swelling.     Cervical back: Neck supple. No rigidity.  Skin:    General: Skin is warm and dry.     Capillary Refill: Capillary refill takes less than 2 seconds.     Coloration: Skin is not jaundiced or pale.     Findings: Rash present.     Comments: Widely distributed flesh-colored/pink maculopapular rash of the upper extremities, with 4-10 lesions per extremity.  Also 3-4 mild early appearing lesions on bilateral abdomen.  No mucosal involvement.  No skin  sloughing.  No discharge or purulence.  No distinct vesicular or pustular formation.  Does not appear plaque-like or bullae.  Negative Nikolsky's sign.  Palms/soles spared.  Neurological:     Mental Status: He is alert and oriented to person, place, and time.  Psychiatric:        Mood and Affect: Mood normal.     ED Results / Procedures / Treatments   Labs (all labs ordered are listed, but only abnormal results are displayed) Labs Reviewed - No data to display  EKG None  Radiology No results found.  Procedures Procedures    Medications Ordered in ED Medications - No data to display  ED Course/ Medical Decision Making/ A&P                             Medical Decision Making Risk Prescription drug management.   Patient is a 57 year old male presenting with rash to the upper extremities and abdomen.  Hx of similar rash when switched detergents in the past.  Admits to recently switching detergents within the last week prior to rash onset.  Reports identical presentation.  Also reports improvement with prednisone taper.  Was recently prescribed a topical ointments by PCP, with little relief.  Patient requesting course of steroids from PCP, however has not heard back, decided to come to the ED for same.    Patient well-appearing on exam.  Mild rash of upper extremities and very mild small localized areas of abdomen consistent with irritant contact dermatitis.  Pt denies any difficulty breathing or swallowing.  No anaphylaxis or angioedema appreciated.  Airway without stridor and handling secretions without difficulty.  No blisters, no pustules, no warmth, no draining sinus tracts, no superficial abscesses, no bullous impetigo, no vesicles, no desquamation, no target lesions with dusky purpura or a central bulla.  Not tender to touch.  No concern for superimposed infection.  Palms/soles spared.  No concern for SJS, TEN, TSS, tick borne illness, syphilis or other life-threatening  condition.  Plan for discharge home with short course of steroids, pepcid and recommend Benadryl as needed for pruritis.  Recommend close follow up with PCP.  Pt satisfied with today's encounter.  After consideration the patient's encounter today, I do not feel today's workup suggests an emergent condition requiring admission or immediate intervention beyond what has been performed at this time.  Safe for discharge; instructed to return immediately for worsening symptoms, change in symptoms or any other concerns.  I have reviewed the patients home medicines and have made adjustments as needed.  Discussed course of treatment with the patient, whom demonstrated understanding.  Patient in agreement and has no further questions.   I discussed the  patient and their case with my attending, Dr. Pearline Cables, who agreed with the proposed treatment course and/or implemented changes to plan.    This chart was dictated using voice recognition software.  Despite best efforts to proofread,  errors can occur which can change the documentation meaning.         Final Clinical Impression(s) / ED Diagnoses Final diagnoses:  Rash    Rx / DC Orders ED Discharge Orders          Ordered    predniSONE (DELTASONE) 50 MG tablet  Daily        12/25/22 1625              Prince Rome, PA-C 76/19/50 1901    Jeanell Sparrow, DO 12/26/22 2130

## 2022-12-25 NOTE — ED Triage Notes (Signed)
Pt presents with rash to bilateral forearms and left flank.  States a similar rash was had in the past that responded to prednisone.

## 2022-12-25 NOTE — Discharge Instructions (Signed)
You were evaluated in the emergency department today for a rash.  Please discontinue the ointment/cream, a prescription for prednisone has been sent to the pharmacy.  Please take as directed.  Always take with plenty of food and water.  Is important to follow-up with your PCP at the beginning of next week as discussed.  If you do not have 1, I provided to local PCP offices that he may contact to establish care.  It is also important to follow-up with the dermatologist, for further evaluation and management of exacerbations like this.  Return to the ED for new or worsening symptoms as discussed.

## 2022-12-25 NOTE — ED Notes (Signed)
Rash noted on various parts of Pt. Arms and legs.  Pt. Has been on meds for this.  Pt. Reports the meds are not working.  New scripts given to Pt. At time of discharge.

## 2023-03-22 ENCOUNTER — Encounter: Payer: Self-pay | Admitting: *Deleted

## 2023-12-03 ENCOUNTER — Encounter (HOSPITAL_BASED_OUTPATIENT_CLINIC_OR_DEPARTMENT_OTHER): Payer: Self-pay

## 2023-12-03 ENCOUNTER — Other Ambulatory Visit: Payer: Self-pay

## 2023-12-03 ENCOUNTER — Emergency Department (HOSPITAL_BASED_OUTPATIENT_CLINIC_OR_DEPARTMENT_OTHER): Payer: Commercial Managed Care - HMO

## 2023-12-03 ENCOUNTER — Emergency Department (HOSPITAL_BASED_OUTPATIENT_CLINIC_OR_DEPARTMENT_OTHER)
Admission: EM | Admit: 2023-12-03 | Discharge: 2023-12-03 | Disposition: A | Discharge status: Home / Self Care | Payer: Commercial Managed Care - HMO | Attending: Emergency Medicine | Admitting: Emergency Medicine

## 2023-12-03 DIAGNOSIS — R079 Chest pain, unspecified: Secondary | ICD-10-CM | POA: Diagnosis present

## 2023-12-03 DIAGNOSIS — Z79899 Other long term (current) drug therapy: Secondary | ICD-10-CM | POA: Diagnosis not present

## 2023-12-03 DIAGNOSIS — I10 Essential (primary) hypertension: Secondary | ICD-10-CM | POA: Diagnosis not present

## 2023-12-03 DIAGNOSIS — E119 Type 2 diabetes mellitus without complications: Secondary | ICD-10-CM | POA: Insufficient documentation

## 2023-12-03 DIAGNOSIS — E876 Hypokalemia: Secondary | ICD-10-CM | POA: Diagnosis not present

## 2023-12-03 DIAGNOSIS — R0789 Other chest pain: Secondary | ICD-10-CM | POA: Insufficient documentation

## 2023-12-03 DIAGNOSIS — T502X5A Adverse effect of carbonic-anhydrase inhibitors, benzothiadiazides and other diuretics, initial encounter: Secondary | ICD-10-CM | POA: Diagnosis not present

## 2023-12-03 HISTORY — DX: Type 2 diabetes mellitus without complications: E11.9

## 2023-12-03 LAB — BASIC METABOLIC PANEL
Anion gap: 8 (ref 5–15)
BUN: 19 mg/dL (ref 6–20)
CO2: 26 mmol/L (ref 22–32)
Calcium: 8.9 mg/dL (ref 8.9–10.3)
Chloride: 101 mmol/L (ref 98–111)
Creatinine, Ser: 0.99 mg/dL (ref 0.61–1.24)
GFR, Estimated: >60 mL/min (ref >60)
Glucose, Bld: 225 mg/dL — ABNORMAL HIGH (ref 70–99)
Potassium: 2.8 mmol/L — ABNORMAL LOW (ref 3.5–5.1)
Sodium: 135 mmol/L (ref 135–145)

## 2023-12-03 LAB — TROPONIN I (HIGH SENSITIVITY): Troponin I (High Sensitivity): 7 ng/L (ref <18)

## 2023-12-03 LAB — CBC
HCT: 41.8 % (ref 39.0–52.0)
Hemoglobin: 13.7 g/dL (ref 13.0–17.0)
MCH: 25.3 pg — ABNORMAL LOW (ref 26.0–34.0)
MCHC: 32.8 g/dL (ref 30.0–36.0)
MCV: 77.1 fL — ABNORMAL LOW (ref 80.0–100.0)
Platelets: 397 10*3/uL (ref 150–400)
RBC: 5.42 MIL/uL (ref 4.22–5.81)
RDW: 15.8 % — ABNORMAL HIGH (ref 11.5–15.5)
WBC: 8.2 10*3/uL (ref 4.0–10.5)
nRBC: 0 % (ref 0.0–0.2)

## 2023-12-03 LAB — MAGNESIUM: Magnesium: 2.1 mg/dL (ref 1.7–2.4)

## 2023-12-03 LAB — D-DIMER, QUANTITATIVE: D-Dimer, Quant: 0.44 ug{FEU}/mL (ref 0.00–0.50)

## 2023-12-03 MED ORDER — POTASSIUM CHLORIDE 10 MEQ/100ML IV SOLN
10.0000 meq | INTRAVENOUS | Status: AC
  Administered 2023-12-03 (×2): 10 meq via INTRAVENOUS
  Filled 2023-12-03 (×2): qty 100

## 2023-12-03 MED ORDER — KETOROLAC TROMETHAMINE 30 MG/ML IJ SOLN
30.0000 mg | Freq: Once | INTRAMUSCULAR | Status: AC
  Administered 2023-12-03: 30 mg via INTRAVENOUS
  Filled 2023-12-03: qty 1

## 2023-12-03 MED ORDER — POTASSIUM CHLORIDE CRYS ER 20 MEQ PO TBCR: 20.0000 meq | EXTENDED_RELEASE_TABLET | Freq: Two times a day (BID) | ORAL | 0 refills | Status: AC

## 2023-12-03 MED ORDER — DEXAMETHASONE SODIUM PHOSPHATE 10 MG/ML IJ SOLN
10.0000 mg | Freq: Once | INTRAMUSCULAR | Status: AC
  Administered 2023-12-03: 10 mg via INTRAVENOUS
  Filled 2023-12-03: qty 1

## 2023-12-03 MED ORDER — POTASSIUM CHLORIDE CRYS ER 20 MEQ PO TBCR
40.0000 meq | EXTENDED_RELEASE_TABLET | Freq: Once | ORAL | Status: AC
  Administered 2023-12-03: 40 meq via ORAL
  Filled 2023-12-03: qty 2

## 2023-12-03 NOTE — Discharge Instructions (Addendum)
Continue taking celecoxib.  You may add acetaminophen to get additional pain relief.  Apply ice to the areas which are painful.  Ice can be applied for 30 minutes at a time, 4 times a day.  Your potassium was very low.  This is because of the hydrochlorothiazide that you are taking for your blood pressure.  I am giving you a prescription for potassium, but you will probably need to continue taking it for as long as you are taking this high blood pressure medication.  The prescription and I am giving you is only for 1 month, you will need to follow-up with your primary care provider to get additional prescriptions and continue monitoring her potassium level.  Return if you have any new or concerning symptoms.

## 2023-12-03 NOTE — ED Triage Notes (Signed)
Pt reports 2 days of acute pain to RT chest running down RT arm. Pt did not injure it. Pt thought it was gas, used warm compress, and muscle relaxer and not better. Pain is constant and a throbbing pain. Denies N/V/D. "Something doesn't feel right."  Denies SOB, concerned for PE.

## 2023-12-03 NOTE — ED Notes (Signed)
Pt given a drink with provider okay

## 2023-12-03 NOTE — ED Provider Notes (Signed)
[facility] [facility] Provider Note   CSN: [id number] Arrival date & time: 12/03/23  0120     History  Chief Complaint  Patient presents with   Chest Pain    Zachary Flowers is a 57 y.o. male.  The history is provided by the patient.  Chest Pain He has history of hypertension, diabetes, hyperlipidemia and comes in because of sharp right-sided chest pain for the last 2 days.  Pain is worse with moving but not with taking deep breath.  Is also tender to the touch.  Pain does radiate down his right arm.  He denies dyspnea, nausea, diaphoresis.  He denies cough, fever, chills.  He has tried using local measures without any benefit.  He denies any recent unusual activity.  He is a non-smoker.  There is a strong family history of premature coronary atherosclerosis.  Denies history of travel, surgery, cancer, DVT, use of exogenous estrogens.   Home Medications Prior to Admission medications   Medication Sig Start Date End Date Taking? Authorizing Provider  amLODipine (NORVASC) 10 MG tablet Take 10 mg by mouth daily.    [provider]  atorvastatin (LIPITOR) 20 MG tablet Take 20 mg by mouth daily. 09/29/22   [provider]  diclofenac (VOLTAREN) 75 MG EC tablet Take 75 mg by mouth 2 (two) times daily. 10/19/22   [provider]  hydrochlorothiazide (HYDRODIURIL) 25 MG tablet Take 1 tablet by mouth daily.    [provider]  losartan (COZAAR) 25 MG tablet Take 1 tablet (25 mg total) by mouth at bedtime. 10/06/22 01/04/23  Custovic, Rozell Searing, DO  tiZANidine (ZANAFLEX) 4 MG tablet Take 4 mg by mouth 3 (three) times daily. 09/24/22   [provider]      Allergies    Ace inhibitors    Review of Systems   Review of Systems  Cardiovascular:  Positive for chest pain.  All other systems reviewed and are negative.   Physical Exam Updated Vital Signs BP (!) 153/96 (BP Location: Left Arm)   Pulse 84   Temp 97.9  F (36.6 C) (Oral)   Resp 16   Ht 5\' 11"  (1.803 m)   Wt 97.5 kg   SpO2 98%   BMI 29.99 kg/m  Physical Exam Vitals and nursing note reviewed.   57 year old male, resting comfortably and in no acute distress. Vital signs are significant for elevated blood pressure. Oxygen saturation is 98%, which is normal. Head is normocephalic and atraumatic. PERRLA, EOMI. Oropharynx is clear. Neck is nontender and supple without adenopathy or JVD. Lungs are clear without rales, wheezes, or rhonchi. Chest is mildly tender in the right lateral chest wall. Heart has regular rate and rhythm without murmur. Abdomen is soft, flat, nontender. Extremities have no cyanosis or edema, full range of motion is present. Skin is warm and dry without rash. Neurologic: Mental status is normal, moves all extremities equally.  ED Results / Procedures / Treatments   Labs (all labs ordered are listed, but only abnormal results are displayed) Labs Reviewed  BASIC METABOLIC PANEL - Abnormal; Notable for the following components:      Result Value   Potassium 2.8 (*)    Glucose, Bld 225 (*)    All other components within normal limits  CBC - Abnormal; Notable for the following components:   MCV 77.1 (*)    MCH 25.3 (*)    RDW 15.8 (*)    All other components within normal  limits  D-DIMER, QUANTITATIVE  MAGNESIUM  TROPONIN I (HIGH SENSITIVITY)    EKG EKG Interpretation Date/Time:  Friday December 03 2023 01:25:25 EST Ventricular Rate:  85 PR Interval:  155 QRS Duration:  88 QT Interval:  386 QTC Calculation: 459 R Axis:   -22  Text Interpretation: Sinus rhythm Probable left atrial enlargement Abnormal R-wave progression, early transition LVH with secondary repolarization abnormality When compared with ECG of 06/07/2022, No significant change was found Confirmed by Dione Booze ([id number]) on 12/03/2023 1:30:06 AM  Radiology DG Chest 2 View Result Date: 12/03/2023 CLINICAL DATA:  Chest pain EXAM: CHEST - 2  VIEW COMPARISON:  08/29/2020 FINDINGS: The heart size and mediastinal contours are within normal limits. Both lungs are clear. The visualized skeletal structures are unremarkable. IMPRESSION: No active cardiopulmonary disease. Electronically Signed   By: Charlett Nose M.D.   On: 12/03/2023 02:02    Procedures Procedures  Cardiac monitor shows normal sinus rhythm, per my interpretation.  Medications Ordered in ED Medications  potassium chloride 10 mEq in 100 mL IVPB (10 mEq Intravenous New Bag/Given 12/03/23 0543)  ketorolac (TORADOL) 30 MG/ML injection 30 mg (30 mg Intravenous Given 12/03/23 0419)  potassium chloride SA (KLOR-CON M) CR tablet 40 mEq (40 mEq Oral Given 12/03/23 0421)  dexamethasone (DECADRON) injection 10 mg (10 mg Intravenous Given 12/03/23 0540)    ED Course/ Medical Decision Making/ A&P             HEART Score: 4                    Medical Decision Making Amount and/or Complexity of Data Reviewed Labs: ordered. Radiology: ordered.  Risk Prescription drug management.   Chest pain which seems most likely be chest wall pain, doubt ACS.  Clinically, this seems to be chest wall pain.  Patient expresses concern for pulmonary embolism, I cannot completely rule that out.  I have reviewed his electrocardiogram, my interpretation is left ventricular hypertrophy with secondary repolarization changes unchanged from prior.  Chest x-ray shows no active cardiopulmonary disease.  Have independently viewed the images, and agree with the radiologist's interpretation.  I have reviewed his laboratory tests, and my interpretation is moderately severe hypokalemia likely secondary to diuretic use, normal troponin.  With pain being present for 2 days, single troponin is sufficient to rule out ACS.  I am ordering a magnesium level.  I am ordering both oral and intravenous potassium.  Patient relates that he has been having a lot of problems with cramping in his hands, this may be related to  hypokalemia.  I have ordered D-dimer to rule out pulmonary embolism.  I have ordered intravenous ketorolac for pain.  Heart score is 4, which puts him at moderate risk for major adverse cardiac events in the next 6 weeks.  D-dimer is normal, no evidence of pulmonary embolism.  He had moderate relief of pain with ketorolac, I have also ordered a dose of dexamethasone.  Patient informs me that he is actually taking celecoxib at home, so he should not be taking additional NSAIDs.  I am discharging him with prescription for oral potassium, and he will need to follow-up with his primary care provider-he will likely need to take potassium for as long as he is on the diuretic.  I have recommended that he use acetaminophen if he needs additional pain relief beyond what he gets from celecoxib.  Final Clinical Impression(s) / ED Diagnoses Final diagnoses:  Chest wall pain  Diuretic-induced hypokalemia  Rx / DC Orders ED Discharge Orders          Ordered    potassium chloride SA (KLOR-CON M) 20 MEQ tablet  2 times daily        12/03/23 0640              Dione Booze, MD 12/03/23 [phone]

## 2024-11-25 ENCOUNTER — Emergency Department (HOSPITAL_BASED_OUTPATIENT_CLINIC_OR_DEPARTMENT_OTHER)
Admission: EM | Admit: 2024-11-25 | Discharge: 2024-11-25 | Disposition: A | Discharge status: Home / Self Care | Attending: Emergency Medicine | Admitting: Emergency Medicine

## 2024-11-25 ENCOUNTER — Encounter (HOSPITAL_BASED_OUTPATIENT_CLINIC_OR_DEPARTMENT_OTHER): Payer: Self-pay

## 2024-11-25 ENCOUNTER — Other Ambulatory Visit: Payer: Self-pay

## 2024-11-25 ENCOUNTER — Emergency Department (HOSPITAL_BASED_OUTPATIENT_CLINIC_OR_DEPARTMENT_OTHER)

## 2024-11-25 DIAGNOSIS — Z79899 Other long term (current) drug therapy: Secondary | ICD-10-CM | POA: Diagnosis not present

## 2024-11-25 DIAGNOSIS — M25511 Pain in right shoulder: Secondary | ICD-10-CM | POA: Diagnosis not present

## 2024-11-25 DIAGNOSIS — E119 Type 2 diabetes mellitus without complications: Secondary | ICD-10-CM | POA: Insufficient documentation

## 2024-11-25 DIAGNOSIS — R7989 Other specified abnormal findings of blood chemistry: Secondary | ICD-10-CM | POA: Diagnosis not present

## 2024-11-25 DIAGNOSIS — R0789 Other chest pain: Secondary | ICD-10-CM | POA: Diagnosis present

## 2024-11-25 DIAGNOSIS — I10 Essential (primary) hypertension: Secondary | ICD-10-CM | POA: Insufficient documentation

## 2024-11-25 DIAGNOSIS — I7122 Aneurysm of the aortic arch, without rupture: Secondary | ICD-10-CM | POA: Diagnosis not present

## 2024-11-25 LAB — TROPONIN T, HIGH SENSITIVITY
Troponin T High Sensitivity: <15 ng/L (ref 0–19)
Troponin T High Sensitivity: <15 ng/L (ref 0–19)

## 2024-11-25 LAB — BASIC METABOLIC PANEL WITH GFR
Anion gap: 13 (ref 5–15)
BUN: 16 mg/dL (ref 6–20)
CO2: 25 mmol/L (ref 22–32)
Calcium: 9.3 mg/dL (ref 8.9–10.3)
Chloride: 104 mmol/L (ref 98–111)
Creatinine, Ser: 0.95 mg/dL (ref 0.61–1.24)
GFR, Estimated: >60 mL/min
Glucose, Bld: 152 mg/dL — ABNORMAL HIGH (ref 70–99)
Potassium: 3.3 mmol/L — ABNORMAL LOW (ref 3.5–5.1)
Sodium: 142 mmol/L (ref 135–145)

## 2024-11-25 LAB — CBC WITH DIFFERENTIAL/PLATELET
Abs Immature Granulocytes: 0.03 K/uL (ref 0.00–0.07)
Basophils Absolute: 0 K/uL (ref 0.0–0.1)
Basophils Relative: 1 %
Eosinophils Absolute: 0.8 K/uL — ABNORMAL HIGH (ref 0.0–0.5)
Eosinophils Relative: 9 %
HCT: 42 % (ref 39.0–52.0)
Hemoglobin: 13.7 g/dL (ref 13.0–17.0)
Immature Granulocytes: 0 %
Lymphocytes Relative: 47 %
Lymphs Abs: 4.2 K/uL — ABNORMAL HIGH (ref 0.7–4.0)
MCH: 25.4 pg — ABNORMAL LOW (ref 26.0–34.0)
MCHC: 32.6 g/dL (ref 30.0–36.0)
MCV: 77.9 fL — ABNORMAL LOW (ref 80.0–100.0)
Monocytes Absolute: 0.6 K/uL (ref 0.1–1.0)
Monocytes Relative: 7 %
Neutro Abs: 3.2 K/uL (ref 1.7–7.7)
Neutrophils Relative %: 36 %
Platelets: 435 K/uL — ABNORMAL HIGH (ref 150–400)
RBC: 5.39 MIL/uL (ref 4.22–5.81)
RDW: 16.3 % — ABNORMAL HIGH (ref 11.5–15.5)
WBC: 8.9 K/uL (ref 4.0–10.5)
nRBC: 0 % (ref 0.0–0.2)

## 2024-11-25 LAB — D-DIMER, QUANTITATIVE: D-Dimer, Quant: 1.34 ug{FEU}/mL — ABNORMAL HIGH (ref 0.00–0.50)

## 2024-11-25 MED ORDER — MORPHINE SULFATE (PF) 4 MG/ML IV SOLN
4.0000 mg | Freq: Once | INTRAVENOUS | Status: AC
  Administered 2024-11-25: 4 mg via INTRAVENOUS
  Filled 2024-11-25: qty 1

## 2024-11-25 MED ORDER — SODIUM CHLORIDE 0.9 % IV BOLUS
500.0000 mL | Freq: Once | INTRAVENOUS | Status: AC
  Administered 2024-11-25: 500 mL via INTRAVENOUS

## 2024-11-25 MED ORDER — IOHEXOL 350 MG/ML SOLN
75.0000 mL | Freq: Once | INTRAVENOUS | Status: AC | PRN
  Administered 2024-11-25: 75 mL via INTRAVENOUS

## 2024-11-25 MED ORDER — MELOXICAM 7.5 MG PO TABS: 7.5000 mg | ORAL_TABLET | Freq: Every day | ORAL | 0 refills | Status: DC

## 2024-11-25 MED ORDER — DEXAMETHASONE SOD PHOSPHATE PF 10 MG/ML IJ SOLN
10.0000 mg | Freq: Once | INTRAMUSCULAR | Status: AC
  Administered 2024-11-25: 10 mg via INTRAVENOUS

## 2024-11-25 MED ORDER — KETOROLAC TROMETHAMINE 15 MG/ML IJ SOLN
15.0000 mg | Freq: Once | INTRAMUSCULAR | Status: AC
  Administered 2024-11-25: 15 mg via INTRAVENOUS
  Filled 2024-11-25: qty 1

## 2024-11-25 NOTE — Discharge Instructions (Addendum)
 Make an appointment to have close follow-up with your primary care doctor to make sure your symptoms are improving.  Start taking your gabapentin more regularly for a few days.  You can also use the Mobic  that was prescribed for pain and inflammation.  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  This needs periodic monitoring by your primary care doctor.  Return to the emergency room if you have any worsening symptoms.

## 2024-11-25 NOTE — ED Triage Notes (Signed)
 Pt presents with sharp chest pain on the right side that started two days ago. States it is worse this morning.   The pain is radiating down his right arm. Denies shob or any other symptoms.

## 2024-11-25 NOTE — ED Notes (Signed)
Pt is currently at CT. 

## 2024-11-25 NOTE — ED Provider Notes (Addendum)
 " [place] [facility] Provider Note   CSN: [id number] Arrival date & time: 11/25/24  0725     Patient presents with: Chest Pain   Zachary Flowers is a 58 y.o. male.   Patient is a 58 year old who presents with some right sided chest pain.  He said it started yesterday but got worse through the night.  He has a history of diabetes and hypertension as well as arthritis.  He said the pain is sharp and in his right chest going down his right arm.  It is worse if he moves his arm around.  It is a little bit worse when he takes a deep breath.  He denies any other chest pain.  No associated shortness of breath.  No swelling of the arm.  No numbness or weakness in the hand.  He was actually seen here right about a year ago for similar symptoms.  He said it had resolved after he was seen in the ED.  He denies any known injuries to his shoulder or arm.       Prior to Admission medications  Medication Sig Start Date End Date Taking? Authorizing Provider  meloxicam  (MOBIC ) 7.5 MG tablet Take 1 tablet (7.5 mg total) by mouth daily. 11/25/24  Yes Lenor Hollering, MD  amLODipine (NORVASC) 10 MG tablet Take 10 mg by mouth daily.    [provider]  atorvastatin (LIPITOR) 20 MG tablet Take 20 mg by mouth daily. 09/29/22   [provider]  diclofenac (VOLTAREN) 75 MG EC tablet Take 75 mg by mouth 2 (two) times daily. 10/19/22   [provider]  hydrochlorothiazide (HYDRODIURIL) 25 MG tablet Take 1 tablet by mouth daily.    [provider]  losartan  (COZAAR ) 25 MG tablet Take 1 tablet (25 mg total) by mouth at bedtime. 10/06/22 01/04/23  Custovic, Sabina, DO  potassium chloride  SA (KLOR-CON  M) 20 MEQ tablet Take 1 tablet (20 mEq total) by mouth 2 (two) times daily. 12/03/23   Raford Lenis, MD  tiZANidine (ZANAFLEX) 4 MG tablet Take 4 mg by mouth 3 (three) times daily. 09/24/22   [provider]    Allergies: Ace inhibitors     Review of Systems  Constitutional:  Negative for chills, diaphoresis, fatigue and fever.  HENT:  Negative for congestion, rhinorrhea and sneezing.   Eyes: Negative.   Respiratory:  Negative for cough, chest tightness and shortness of breath.   Cardiovascular:  Positive for chest pain. Negative for leg swelling.  Gastrointestinal:  Negative for abdominal pain, diarrhea, nausea and vomiting.  Genitourinary:  Negative for difficulty urinating, flank pain and frequency.  Musculoskeletal:  Positive for arthralgias. Negative for back pain.  Skin:  Negative for rash.  Neurological:  Negative for dizziness, speech difficulty, weakness, numbness and headaches.    Updated Vital Signs BP (!) 143/101   Pulse 79   Temp 97.9 F (36.6 C) (Oral)   Resp 20   SpO2 96%   Physical Exam Constitutional:      Appearance: He is well-developed.  HENT:     Head: Normocephalic and atraumatic.  Eyes:     Pupils: Pupils are equal, round, and reactive to light.  Cardiovascular:     Rate and Rhythm: Normal rate and regular rhythm.     Heart sounds: Normal heart sounds.  Pulmonary:     Effort: Pulmonary effort is normal. No respiratory distress.     Breath sounds: Normal breath sounds. No wheezing or rales.  Chest:     Chest wall: No tenderness.     Comments: Positive tenderness to the right lateral chest wall and to the right axilla.  There are some pain on range of motion of the right shoulder.  No swelling of the arm.  Radial pulses intact.  He has normal sensation and motor function distally in the hand.  No rashes.  No crepitus or deformity. Abdominal:     General: Bowel sounds are normal.     Palpations: Abdomen is soft.     Tenderness: There is no abdominal tenderness. There is no guarding or rebound.  Musculoskeletal:        General: Normal range of motion.     Cervical back: Normal range of motion and neck supple.  Lymphadenopathy:     Cervical: No cervical adenopathy.  Skin:    General:  Skin is warm and dry.     Findings: No rash.  Neurological:     Mental Status: He is alert and oriented to person, place, and time.     (all labs ordered are listed, but only abnormal results are displayed) Labs Reviewed  BASIC METABOLIC PANEL WITH GFR - Abnormal; Notable for the following components:      Result Value   Potassium 3.3 (*)    Glucose, Bld 152 (*)    All other components within normal limits  CBC WITH DIFFERENTIAL/PLATELET - Abnormal; Notable for the following components:   MCV 77.9 (*)    MCH 25.4 (*)    RDW 16.3 (*)    Platelets 435 (*)    Lymphs Abs 4.2 (*)    Eosinophils Absolute 0.8 (*)    All other components within normal limits  D-DIMER, QUANTITATIVE - Abnormal; Notable for the following components:   D-Dimer, Quant 1.34 (*)    All other components within normal limits  TROPONIN T, HIGH SENSITIVITY  TROPONIN T, HIGH SENSITIVITY    EKG: EKG Interpretation Date/Time:  Saturday November 25 2024 07:35:18 EST Ventricular Rate:  93 PR Interval:  153 QRS Duration:  94 QT Interval:  385 QTC Calculation: 479 R Axis:   -7  Text Interpretation: Sinus rhythm Abnormal R-wave progression, early transition Left ventricular hypertrophy Borderline prolonged QT interval since last tracing no significant change Confirmed by Lenor Hollering [phone]) on 11/25/2024 8:17:27 AM  Radiology: CT Angio Chest PE W/Cm &/Or Wo Cm Result Date: 11/25/2024 EXAM: CTA CHEST 11/25/2024 08:42:47 AM TECHNIQUE: CTA of the chest was performed with and without the administration of 75 mL of iohexol  (OMNIPAQUE ) 350 MG/ML injection. Multiplanar reformatted images are provided for review. MIP images are provided for review. Automated exposure control, iterative reconstruction, and/or weight based adjustment of the mA/kV was utilized to reduce the radiation dose to as low as reasonably achievable. COMPARISON: None available. CLINICAL HISTORY: Pulmonary embolism (PE) suspected, low to intermediate  prob, positive D-dimer. FINDINGS: PULMONARY ARTERIES: Pulmonary arteries are adequately opacified for evaluation. No acute pulmonary embolus. Main pulmonary artery is normal in caliber. MEDIASTINUM: Cardiac enlargement. The posterior aortic arch measures 4.4 cm in maximum dimension. No signs of aortic dissection. LYMPH NODES: No mediastinal, hilar or axillary lymphadenopathy. LUNGS AND PLEURA: Depending ground-glass attenuation noted within the lower lung zones. No focal consolidation or pulmonary edema. No pneumothorax, pleural effusion, or consolidative changes. UPPER ABDOMEN: Limited images of the upper abdomen are unremarkable. SOFT TISSUES AND BONES: No acute bone or soft tissue abnormality. IMPRESSION: 1. No pulmonary embolism. 2. 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 This recommendation follows 2010 ACCF/AHA/AATS/ACR/ASA/SCA/SCAI/SIR/STS/SVM Guidelines for the Diagnosis and Management of Patients with Thoracic Aortic Disease. Circulation. 2010; 121: Z733-z630. Aortic aneurysm NOS (ICD10-I71.9) Electronically signed by: Waddell Calk MD 11/25/2024 09:08 AM EST RP Workstation: [id number]   DG Chest 2 View Result Date: 11/25/2024 EXAM: 2 VIEW(S) XRAY OF THE CHEST 11/25/2024 08:15:00 AM COMPARISON: 12/03/2023 CLINICAL HISTORY: chest pain FINDINGS: LUNGS AND PLEURA: No focal pulmonary opacity. No pleural effusion. No pneumothorax. HEART AND MEDIASTINUM: No acute abnormality of the cardiac and mediastinal silhouettes. BONES AND SOFT TISSUES: No acute osseous abnormality. IMPRESSION: 1. No acute process. Electronically signed by: Evalene Coho MD 11/25/2024 08:21 AM EST RP Workstation: [id number]     Procedures   Medications Ordered in the ED  ketorolac  (TORADOL ) 15 MG/ML injection 15 mg (15 mg Intravenous Given 11/25/24 0752)  dexamethasone  (DECADRON ) injection 10 mg (10 mg Intravenous Given 11/25/24 0850)  sodium  chloride 0.9 % bolus 500 mL (0 mLs Intravenous Stopped 11/25/24 1016)  iohexol  (OMNIPAQUE ) 350 MG/ML injection 75 mL (75 mLs Intravenous Contrast Given 11/25/24 0836)  morphine  (PF) 4 MG/ML injection 4 mg (4 mg Intravenous Given 11/25/24 1007)                                    Medical Decision Making Amount and/or Complexity of Data Reviewed Labs: ordered. Radiology: ordered.  Risk Prescription drug management.   This patient presents to the ED for concern of chest pain, this involves an extensive number of treatment options, and is a complaint that carries with it a high risk of complications and morbidity.  I considered the following differential and admission for this acute, potentially life threatening condition.  The differential diagnosis includes musculoskeletal pain, ACS, PE, aortic dissection, pneumothorax, pleurisy  MDM:    Patient is a 58 year old who presents with right sided chest pain.  It is worse with movement of his right arm and he does have some reproducible tenderness.  His EKG does not show any ischemic changes.  He has had 2 negative troponins.  His labs are nonconcerning other than his D-dimer was elevated.  CT scan of his chest shows no evidence of PE.  No aortic dissection.  There is an aortic aneurysm incidentally.  Patient was given Toradol , Decadron  and ultimately morphine .  His pain is well-controlled currently.  It is reproducible and I suspect is musculoskeletal, likely originating from his shoulder.  He has gabapentin that he was prescribed last year for the same type pain.  Advised him to start taking this more regularly.  Will also prescribe Mobic .  He was encouraged to have close follow-up with his PCP.  Will need periodic monitoring of his aneurysm which I discussed with him and his family member at bedside.  He was discharged home in good condition.  Return precautions were given.  On discharge, patient notes that he has Celebrex at home.  Will cancel the  Mobic .  Advised him that he can only take 1 or the other.  (Labs, imaging, consults)  Labs: I Ordered, and personally interpreted labs.  The pertinent results include: Elevated D-dimer, normal troponins  Imaging Studies ordered: I ordered imaging studies including chest x-ray, CT chest I independently visualized and interpreted imaging. I agree with the radiologist interpretation  Additional history obtained from family member at bedside.  External records from outside source obtained and reviewed including history  Cardiac Monitoring: The patient was maintained on a cardiac monitor.  If on the cardiac monitor, I personally viewed and interpreted the cardiac monitored which showed an underlying rhythm of: Sinus rhythm  Reevaluation: After the interventions noted above, I reevaluated the patient and found that they have :improved  Social Determinants of Health:    Disposition: Discharged to home  Co morbidities that complicate the patient evaluation  Past Medical History:  Diagnosis Date   Arthritis    Diabetes mellitus without complication (HCC)    Hypertension      Medicines Meds ordered this encounter  Medications   ketorolac  (TORADOL ) 15 MG/ML injection 15 mg   dexamethasone  (DECADRON ) injection 10 mg   sodium chloride  0.9 % bolus 500 mL   iohexol  (OMNIPAQUE ) 350 MG/ML injection 75 mL   morphine  (PF) 4 MG/ML injection 4 mg   meloxicam  (MOBIC ) 7.5 MG tablet    Sig: Take 1 tablet (7.5 mg total) by mouth daily.    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

## 2024-12-05 ENCOUNTER — Other Ambulatory Visit (INDEPENDENT_AMBULATORY_CARE_PROVIDER_SITE_OTHER)

## 2024-12-05 ENCOUNTER — Encounter: Payer: Self-pay | Admitting: Physician Assistant

## 2024-12-05 ENCOUNTER — Ambulatory Visit: Admitting: Physician Assistant

## 2024-12-05 DIAGNOSIS — M25511 Pain in right shoulder: Secondary | ICD-10-CM

## 2024-12-05 DIAGNOSIS — M542 Cervicalgia: Secondary | ICD-10-CM

## 2024-12-05 MED ORDER — METHYLPREDNISOLONE 4 MG PO TBPK: ORAL_TABLET | ORAL | 0 refills | Status: AC

## 2024-12-05 NOTE — Progress Notes (Signed)
 "  Office Visit Note   Patient: Zachary Flowers           Date of Birth: May 02, 1966           MRN: [id number] Visit Date: 12/05/2024              Requested by: Center, [facility] [address] [place],  [place] [address] PCP: Center, [facility]   Assessment & Plan: Visit Diagnoses:  1. Right shoulder pain, unspecified chronicity   2. Neck pain     Plan: Patient is a pleasant 58 year old gentleman who comes in today with a 3-week history of right shoulder pain and axilla pain that radiates down to his wrist.  He describes it as a neuropathic type pain that ends and numbness going into his hand.  Denies any coldness any change in temperature or any weakness.  X-rays of both his shoulder and neck he does have some AC arthropathy could indicate some rotator cuff tendinitis however he did say he was given a subacromial injection did not completely relieve his symptoms.  He has significant degenerative changes and straightening of the lordotic curve in his neck.  Given the radicular nature and this could be coming from his cervical spine.  He has no differences in temperature no differences in pulses strength is intact no discoloration in his upper extremity no palpable masses in his axilla.  Will treat him with a course of oral steroids and recheck with him in 2 weeks.  She knows to go to the emergency room if he has any increasing symptoms  Follow-Up Instructions: No follow-ups on file.   Orders:  Orders Placed This Encounter  Procedures   XR Shoulder Right   XR Cervical Spine 2 or 3 views   Meds ordered this encounter  Medications   methylPREDNISolone  (MEDROL  DOSEPAK) 4 MG TBPK tablet    Sig: Take as directed with food    Dispense:  21 tablet    Refill:  0      Procedures: No procedures performed   Clinical Data: No additional findings.   Subjective: Chief Complaint  Patient presents with   Right Shoulder - Pain    Pain shoots down the right arm, aching,  thrbbing, feels as if someone cut the arm off, has had cortisone injection     HPI Pleasant 58 year old gentleman accompanied with his mother has a 3-week history of pain in the shoulder going in the axilla with pain shooting down to his wrist.  Denies any weakness denies any fever chills denies any shortness of breath.  He was seen and evaluated for this in the emergency room to rule out a cardiac cause which was ruled out he was told to follow-up with his primary care.  Comes in today with the most pain being from radiation of the pain from the right shoulder down to the wrist with some paresthesias Review of Systems  All other systems reviewed and are negative.    Objective: Vital Signs: There were no vitals taken for this visit.  Physical Exam Constitutional:      Appearance: Normal appearance.  Pulmonary:     Effort: Pulmonary effort is normal.  Skin:    General: Skin is warm and dry.  Neurological:     General: No focal deficit present.     Mental Status: He is alert and oriented to person, place, and time.  Psychiatric:        Mood and Affect: Mood normal.  Behavior: Behavior normal.     Ortho Exam Examination of his right shoulder he has full forward elevation he can do internal rotation on the behind the back but does reproduce symptoms.  He has positive empty can test which reproduces symptoms in his shoulder.  He has good biceps tricep triceps and abductor strength.  He has good grip strength pulses are equal and intact he has brisk capillary refill Specialty Comments:  No specialty comments available.  Imaging: XR Cervical Spine 2 or 3 views Result Date: 12/05/2024 Radiographs of the cervical spine show straightening of the normal lordosis.  He has significant degenerative changes throughout the cervical spine.  No acute fractures  XR Shoulder Right Result Date: 12/05/2024 Radiographs of his right shoulder were reviewed today.  He does have degenerative  changes especially of the [facility] joint.  No acute fracture noted no evidence of dislocation    PMFS History: Patient Active Problem List   Diagnosis Date Noted   Subacromial bursitis of right shoulder joint 10/26/2022   Tendinitis of left rotator cuff 10/26/2022   Primary osteoarthritis of both knees 10/26/2022   Primary hypertension 10/06/2022   Mixed hyperlipidemia 10/06/2022   Type 2 diabetes mellitus without complication, without long-term current use of insulin (HCC) 10/06/2022   Past Medical History:  Diagnosis Date   Arthritis    Diabetes mellitus without complication (HCC)    Hypertension     History reviewed. No pertinent family history.  History reviewed. No pertinent surgical history. Social History   Occupational History   Not on file  Tobacco Use   Smoking status: Former   Smokeless tobacco: Never  Vaping Use   Vaping status: Never Used  Substance and Sexual Activity   Alcohol use: No   Drug use: Yes    Types: Marijuana   Sexual activity: Not on file        "

## 2024-12-19 ENCOUNTER — Ambulatory Visit: Admitting: Physician Assistant

## 2024-12-20 ENCOUNTER — Ambulatory Visit: Admitting: Physician Assistant

## 2024-12-27 ENCOUNTER — Ambulatory Visit: Admitting: Physician Assistant

## 2024-12-27 ENCOUNTER — Encounter: Payer: Self-pay | Admitting: Physician Assistant

## 2024-12-27 DIAGNOSIS — M542 Cervicalgia: Secondary | ICD-10-CM

## 2024-12-27 DIAGNOSIS — M5010 Cervical disc disorder with radiculopathy, unspecified cervical region: Secondary | ICD-10-CM | POA: Diagnosis not present

## 2024-12-27 DIAGNOSIS — M25511 Pain in right shoulder: Secondary | ICD-10-CM

## 2024-12-27 NOTE — Progress Notes (Signed)
 "  Office Visit Note   Patient: Zachary Flowers           Date of Birth: 1966/07/29           MRN: [id number] Visit Date: 12/27/2024              Requested by: Center, [facility] [address] [place],  [place] [address] PCP: Center, [facility]  No chief complaint on file.     HPI:  Patient is a pleasant 59 year old gentleman who has a history of both rotator cuff issues and neck pain with radiculopathy.  I did start him on a Dosepak he said that brought the pain down from an 8 to a 5.  He is now starting to get some paresthesias in his short finger.  No loss of strength.  Other causes for his pain could be radial tunnel syndrome but with the association with movement of his neck I will think that physical therapy would be appropriate to try first could consider nerve studies as well Assessment & Plan: Visit Diagnoses:  1. Neck pain   2. Right shoulder pain, unspecified chronicity   3. Cervical disc disorder with radiculopathy, unspecified cervical region     Plan: He has gotten marginally better.  Will have him do a trial of physical therapy.  If this exacerbates his symptoms or they get worse he will contact me and I will order an MRI of his cervical spine could be some his rotator cuff issues but we will see how physical therapy helps him  Follow-Up Instructions: No follow-ups on file.   Ortho Exam  Patient is alert, oriented, no adenopathy, well-dressed, normal affect, normal respiratory effort. Examination he has good extension and flexion of his neck and turning to the left.  When he turns to the right it reproduces symptoms going down from his axilla down into his wrist and with some paresthesias of his short finger.  Biceps tricep strength remains strong has brisk capillary refill and is otherwise neurovascularly intact distally    Imaging: No results found. No images are attached to the encounter.  Labs: No results found for: HGBA1C, ESRSEDRATE, CRP,  LABURIC, REPTSTATUS, GRAMSTAIN, CULT, LABORGA   Lab Results  Component Value Date   ALBUMIN 3.6 06/07/2022    Lab Results  Component Value Date   MG 2.1 12/03/2023   No results found for: VD25OH  No results found for: PREALBUMIN    Latest Ref Rng & Units 11/25/2024    7:40 AM 12/03/2023    1:38 AM 06/07/2022    9:28 AM  CBC EXTENDED  WBC 4.0 - 10.5 K/uL 8.9  8.2  8.8   RBC 4.22 - 5.81 MIL/uL 5.39  5.42  5.37   Hemoglobin 13.0 - 17.0 g/dL 86.2  86.2  86.4   HCT 39.0 - 52.0 % 42.0  41.8  40.6   Platelets 150 - 400 K/uL 435  397  476   NEUT# 1.7 - 7.7 K/uL 3.2   5.5   Lymph# 0.7 - 4.0 K/uL 4.2   2.2      There is no height or weight on file to calculate BMI.  Orders:  Orders Placed This Encounter  Procedures   Ambulatory referral to Physical Therapy   No orders of the defined types were placed in this encounter.    Procedures: No procedures performed  Clinical Data: No additional findings.  ROS:  All other systems negative, except as noted in the HPI. Review  of Systems  Objective: Vital Signs: There were no vitals taken for this visit.  Specialty Comments:  No specialty comments available.  PMFS History: Patient Active Problem List   Diagnosis Date Noted   Cervical disc disorder with radiculopathy, unspecified cervical region 12/27/2024   Subacromial bursitis of right shoulder joint 10/26/2022   Tendinitis of left rotator cuff 10/26/2022   Primary osteoarthritis of both knees 10/26/2022   Primary hypertension 10/06/2022   Mixed hyperlipidemia 10/06/2022   Type 2 diabetes mellitus without complication, without long-term current use of insulin (HCC) 10/06/2022   Past Medical History:  Diagnosis Date   Arthritis    Diabetes mellitus without complication (HCC)    Hypertension     No family history on file.  No past surgical history on file. Social History   Occupational History   Not on file  Tobacco Use   Smoking status: Former    Smokeless tobacco: Never  Vaping Use   Vaping status: Never Used  Substance and Sexual Activity   Alcohol use: No   Drug use: Yes    Types: Marijuana   Sexual activity: Not on file       "

## 2025-01-09 ENCOUNTER — Ambulatory Visit

## 2025-01-16 ENCOUNTER — Ambulatory Visit
